# Patient Record
Sex: Female | Born: 1980 | Hispanic: No | Smoking: Never smoker
Health system: Southern US, Community
[De-identification: ages and names within clinical notes are randomized; demographics above are authoritative.]

## PROBLEM LIST (undated history)

## (undated) DIAGNOSIS — D649 Anemia, unspecified: Secondary | ICD-10-CM

## (undated) DIAGNOSIS — J45909 Unspecified asthma, uncomplicated: Secondary | ICD-10-CM

## (undated) DIAGNOSIS — N83209 Unspecified ovarian cyst, unspecified side: Secondary | ICD-10-CM

## (undated) DIAGNOSIS — T7840XA Allergy, unspecified, initial encounter: Secondary | ICD-10-CM

## (undated) HISTORY — DX: Allergy, unspecified, initial encounter: T78.40XA

## (undated) HISTORY — PX: LEEP: SHX91

---

## 2008-07-23 ENCOUNTER — Other Ambulatory Visit: Admission: RE | Admit: 2008-07-23 | Discharge: 2008-07-23 | Payer: Self-pay | Admitting: Obstetrics and Gynecology

## 2008-12-01 ENCOUNTER — Inpatient Hospital Stay (HOSPITAL_COMMUNITY): Admission: AD | Admit: 2008-12-01 | Discharge: 2008-12-02 | Payer: Self-pay | Admitting: Obstetrics and Gynecology

## 2010-06-03 ENCOUNTER — Emergency Department (HOSPITAL_COMMUNITY)
Admission: EM | Admit: 2010-06-03 | Discharge: 2010-06-03 | Payer: Self-pay | Source: Home / Self Care | Admitting: Emergency Medicine

## 2010-10-10 LAB — CBC
HCT: 28.8 % — ABNORMAL LOW (ref 36.0–46.0)
Hemoglobin: 11.8 g/dL — ABNORMAL LOW (ref 12.0–15.0)
MCHC: 35.5 g/dL (ref 30.0–36.0)
MCV: 104.3 fL — ABNORMAL HIGH (ref 78.0–100.0)
Platelets: 152 10*3/uL (ref 150–400)
RBC: 3.21 MIL/uL — ABNORMAL LOW (ref 3.87–5.11)
WBC: 10 10*3/uL (ref 4.0–10.5)

## 2010-11-15 NOTE — Op Note (Signed)
Gabrielle Zimmerman, Gabrielle Zimmerman               ACCOUNT NO.:  [id number]   MEDICAL RECORD NO.:  [id number]          PATIENT TYPE:  INP   LOCATION:  9105                          FACILITY:  [facility]   PHYSICIAN:  Gerald Leitz, MD          DATE OF BIRTH:  1980/11/11   DATE OF PROCEDURE:  12/01/2008  DATE OF DISCHARGE:                               OPERATIVE REPORT   DATE OF DELIVERY:  December 01, 2008.   PREOPERATIVE DIAGNOSES:  1. Term labor.  2. Fetal bradycardia.   POSTOPERATIVE DIAGNOSES:  1. Term labor.  2. Fetal bradycardia.   PROCEDURE:  Vacuum-assisted vaginal delivery.   SURGEON:  Gerald Leitz, MD   ANESTHESIA:  Epidural.   COMPLICATIONS:  None.   INDICATIONS:  A 39-week intrauterine pregnancy with term labor,  completely dilated with fetal bradycardia at a +2 station.   PROCEDURE:  Informed consent was obtained.  The patient was informed of  risks of hematoma.  She voiced understanding and gave consent to proceed  with vacuum delivery.  The position of the baby was OP and the bladder  was drained.  The Kiwi vacuum was placed and set at 550 mmHg with  contraction.  As the patient pushed, the vacuum was used to extract the  baby.  There was one pop off.  The vacuum was then reapplied and  delivery was achieved with a second attempt.  Vacuum was immediately  removed.  The infant's mouth and nose were bulb suctioned.  There was no  nuchal cord.  Anterior shoulder and body were delivered without  difficulty.  The infant was somewhat flaccid and the cord was clamped x2  and cut.  The infant was taken immediately to the warmer.  Apgars were 4  and 9 at one and five minutes respectively.  Infant's weight was 6  pounds 14 ounces.  The placenta was delivered.  The uterus was firmed  with Pitocin and massage.  Second degree perineal laceration was  repaired with 3-0 Vicryl.  The patient tolerated the procedure well.      Gerald Leitz, MD  Electronically Signed    TC/MEDQ  D:  12/02/2008  T:   12/02/2008  Job:  [id number]

## 2011-02-13 ENCOUNTER — Emergency Department (INDEPENDENT_AMBULATORY_CARE_PROVIDER_SITE_OTHER): Payer: BC Managed Care – PPO

## 2011-02-13 ENCOUNTER — Emergency Department (HOSPITAL_BASED_OUTPATIENT_CLINIC_OR_DEPARTMENT_OTHER)
Admission: EM | Admit: 2011-02-13 | Discharge: 2011-02-13 | Disposition: A | Discharge status: Home / Self Care | Payer: BC Managed Care – PPO | Attending: Emergency Medicine | Admitting: Emergency Medicine

## 2011-02-13 DIAGNOSIS — N83209 Unspecified ovarian cyst, unspecified side: Secondary | ICD-10-CM

## 2011-02-13 DIAGNOSIS — N9489 Other specified conditions associated with female genital organs and menstrual cycle: Secondary | ICD-10-CM | POA: Insufficient documentation

## 2011-02-13 DIAGNOSIS — Z975 Presence of (intrauterine) contraceptive device: Secondary | ICD-10-CM

## 2011-02-13 DIAGNOSIS — B9689 Other specified bacterial agents as the cause of diseases classified elsewhere: Secondary | ICD-10-CM | POA: Insufficient documentation

## 2011-02-13 DIAGNOSIS — A499 Bacterial infection, unspecified: Secondary | ICD-10-CM | POA: Insufficient documentation

## 2011-02-13 DIAGNOSIS — N760 Acute vaginitis: Secondary | ICD-10-CM | POA: Insufficient documentation

## 2011-02-13 DIAGNOSIS — N949 Unspecified condition associated with female genital organs and menstrual cycle: Secondary | ICD-10-CM

## 2011-02-13 DIAGNOSIS — R109 Unspecified abdominal pain: Secondary | ICD-10-CM | POA: Insufficient documentation

## 2011-02-13 HISTORY — DX: Anemia, unspecified: D64.9

## 2011-02-13 LAB — COMPREHENSIVE METABOLIC PANEL
ALT: 7 U/L (ref 0–35)
AST: 18 U/L (ref 0–37)
CO2: 23 mEq/L (ref 19–32)
Chloride: 102 mEq/L (ref 96–112)
GFR calc non Af Amer: >60 mL/min (ref >60)
Sodium: 136 mEq/L (ref 135–145)
Total Bilirubin: 0.4 mg/dL (ref 0.3–1.2)

## 2011-02-13 LAB — URINE MICROSCOPIC-ADD ON

## 2011-02-13 LAB — URINALYSIS, ROUTINE W REFLEX MICROSCOPIC
Bilirubin Urine: NEGATIVE
Glucose, UA: NEGATIVE mg/dL
Ketones, ur: 15 mg/dL — AB
Nitrite: NEGATIVE
pH: 8.5 — ABNORMAL HIGH (ref 5.0–8.0)

## 2011-02-13 LAB — WET PREP, GENITAL

## 2011-02-13 LAB — CBC
Platelets: 185 10*3/uL (ref 150–400)
RBC: 3.53 MIL/uL — ABNORMAL LOW (ref 3.87–5.11)
WBC: 9.5 10*3/uL (ref 4.0–10.5)

## 2011-02-13 LAB — RPR: RPR Ser Ql: NONREACTIVE

## 2011-02-13 MED ORDER — METRONIDAZOLE 500 MG PO TABS: 500.0000 mg | ORAL_TABLET | Freq: Two times a day (BID) | ORAL | Status: AC

## 2011-02-13 MED ORDER — METRONIDAZOLE 500 MG PO TABS: 500.0000 mg | ORAL_TABLET | Freq: Two times a day (BID) | ORAL | Status: DC

## 2011-02-13 MED ORDER — OXYCODONE-ACETAMINOPHEN 5-325 MG PO TABS: 1.0000 | ORAL_TABLET | ORAL | Status: AC | PRN

## 2011-02-13 MED ORDER — ONDANSETRON HCL 4 MG/2ML IJ SOLN
4.0000 mg | Freq: Once | INTRAMUSCULAR | Status: AC
  Administered 2011-02-13: 4 mg via INTRAVENOUS
  Filled 2011-02-13: qty 2

## 2011-02-13 MED ORDER — HYDROMORPHONE HCL 1 MG/ML IJ SOLN
1.0000 mg | Freq: Once | INTRAMUSCULAR | Status: AC
  Administered 2011-02-13: 1 mg via INTRAVENOUS
  Filled 2011-02-13: qty 1

## 2011-02-13 MED ORDER — SODIUM CHLORIDE 0.9 % IV BOLUS (SEPSIS)
1000.0000 mL | Freq: Once | INTRAVENOUS | Status: AC
  Administered 2011-02-13: 1000 mL via INTRAVENOUS

## 2011-02-13 NOTE — ED Notes (Signed)
C/o lower back pain and abd pain

## 2011-02-13 NOTE — ED Notes (Signed)
Pt remains in US

## 2011-02-13 NOTE — ED Provider Notes (Signed)
History     CSN: [id number] Arrival date & time: 02/13/2011  1:52 PM  Chief Complaint  Patient presents with  . Back Pain  . Abdominal Pain   Patient is a 30 y.o. female presenting with cramps. The history is provided by the patient.  Abdominal Cramping The primary symptoms of the illness include abdominal pain and nausea. The primary symptoms of the illness do not include fever, vomiting, diarrhea, dysuria, vaginal discharge or vaginal bleeding. Episode onset: PAin started about five hours ago, and has been getting worse. Pain is severe, currently rated at 8/10 with a worst pain of 10/10. The onset of the illness was sudden. The problem has been gradually worsening.  The patient states that she believes she is currently not pregnant (She has an IUD. LMP was July 31.). The patient has not had a change in bowel habit. Symptoms associated with the illness do not include chills, constipation, urgency, hematuria or frequency.  Pain is across the suprapubic area, but worst on the right side. There is radiation to the back. She had a milder pain similar to this one week ago.  Past Medical History  Diagnosis Date  . Anemia     History reviewed. No pertinent past surgical history.  No family history on file.  History  Substance Use Topics  . Smoking status: Never Smoker   . Smokeless tobacco: Not on file  . Alcohol Use: No    OB History    Grav Para Term Preterm Abortions TAB SAB Ect Mult Living                  Review of Systems  Constitutional: Negative for fever and chills.  Gastrointestinal: Positive for nausea and abdominal pain. Negative for vomiting, diarrhea and constipation.  Genitourinary: Negative for dysuria, urgency, frequency, hematuria, vaginal bleeding and vaginal discharge.  All other systems reviewed and are negative.    Physical Exam  BP 112/69  Pulse 69  Temp(Src) 98.1 F (36.7 C) (Oral)  Resp 16  Ht 5\' 6"  (1.676 m)  Wt 162 lb (73.483 kg)  BMI 26.15  kg/m2  SpO2 100%  LMP 01/23/2011  Physical Exam  Nursing note and vitals reviewed. Constitutional: She is oriented to person, place, and time. She appears well-developed and well-nourished.       She appears uncomfortable.  HENT:  Head: Normocephalic and atraumatic.  Right Ear: External ear normal.  Left Ear: External ear normal.  Nose: Nose normal.  Mouth/Throat: Oropharynx is clear and moist.  Eyes: EOM are normal. Pupils are equal, round, and reactive to light. No scleral icterus.  Neck: Normal range of motion. Neck supple. No JVD present.  Cardiovascular: Normal rate, regular rhythm and normal heart sounds.   No murmur heard. Pulmonary/Chest: Effort normal and breath sounds normal. She has no wheezes. She has no rales. She exhibits no tenderness.  Abdominal: Soft. She exhibits no mass. There is no rebound and no guarding.       Moderate suprapubic tenderness without lateralization. Peristalsis is diminished but present.  Genitourinary:       External genitalia is normal. IUD string is in place.Moderate mucoid cervical discharge. Fundus normal size and position, non-tender. Mild left adnexal tenderness without mass. Moderate right adnexal tenderness with approximately 5 cm mass consistent with an ovarian cyst.  Musculoskeletal: Normal range of motion. She exhibits no edema and no tenderness.  Lymphadenopathy:    She has no cervical adenopathy.  Neurological: She is alert and oriented to person,  place, and time.  Skin: Skin is warm and dry. No rash noted.  Psychiatric: She has a normal mood and affect.    ED Course  Procedures  MDM Pelvic pain most likely from ovarian cyst. Doubt PID, appendicitis. Pelvic ultrasound confirms cyst. Pain improved after Dilaudid, Zofran. Labs, x-rays reviewed.  Results for orders placed during the hospital encounter of 02/13/11  URINALYSIS, ROUTINE W REFLEX MICROSCOPIC      Component Value Range   Color, Urine YELLOW  YELLOW    Appearance  CLEAR  CLEAR    Specific Gravity, Urine 1.028  1.005 - 1.030    pH 8.5 (*) 5.0 - 8.0    Glucose, UA NEGATIVE  NEGATIVE (mg/dL)   Hgb urine dipstick NEGATIVE  NEGATIVE    Bilirubin Urine NEGATIVE  NEGATIVE    Ketones, ur 15 (*) NEGATIVE (mg/dL)   Protein, ur 30 (*) NEGATIVE (mg/dL)   Urobilinogen, UA 1.0  0.0 - 1.0 (mg/dL)   Nitrite NEGATIVE  NEGATIVE    Leukocytes, UA NEGATIVE  NEGATIVE   PREGNANCY, URINE      Component Value Range   Preg Test, Ur NEGATIVE    URINE MICROSCOPIC-ADD ON      Component Value Range   Squamous Epithelial / LPF RARE  RARE    WBC, UA 0-2  <3 (WBC/hpf)   RBC / HPF 0-2  <3 (RBC/hpf)   Urine-Other MUCOUS PRESENT    CBC      Component Value Range   WBC 9.5  4.0 - 10.5 (K/uL)   RBC 3.53 (*) 3.87 - 5.11 (MIL/uL)   Hemoglobin 11.6 (*) 12.0 - 15.0 (g/dL)   HCT 21.3 (*) 08.6 - 46.0 (%)   MCV 95.2  78.0 - 100.0 (fL)   MCH 32.9  26.0 - 34.0 (pg)   MCHC 34.5  30.0 - 36.0 (g/dL)   RDW 57.8  46.9 - 62.9 (%)   Platelets 185  150 - 400 (K/uL)  COMPREHENSIVE METABOLIC PANEL      Component Value Range   Sodium 136  135 - 145 (mEq/L)   Potassium 3.8  3.5 - 5.1 (mEq/L)   Chloride 102  96 - 112 (mEq/L)   CO2 23  19 - 32 (mEq/L)   Glucose, Bld 103 (*) 70 - 99 (mg/dL)   BUN 15  6 - 23 (mg/dL)   Creatinine, Ser 5.28  0.50 - 1.10 (mg/dL)   Calcium 9.4  8.4 - 41.3 (mg/dL)   Total Protein 7.8  6.0 - 8.3 (g/dL)   Albumin 4.2  3.5 - 5.2 (g/dL)   AST 18  0 - 37 (U/L)   ALT 7  0 - 35 (U/L)   Alkaline Phosphatase 42  39 - 117 (U/L)   Total Bilirubin 0.4  0.3 - 1.2 (mg/dL)   GFR calc non Af Amer >60  >60 (mL/min)   GFR calc Af Amer >60  >60 (mL/min)  WET PREP, GENITAL      Component Value Range   Yeast, Wet Prep NONE SEEN  NONE SEEN    Trich, Wet Prep NONE SEEN  NONE SEEN    Clue Cells, Wet Prep MODERATE (*) NONE SEEN    WBC, Wet Prep HPF POC FEW (*) NONE SEEN    US Transvaginal Non-ob  02/13/2011  *RADIOLOGY REPORT*  Clinical Data: Pelvic pain radiating to lower  back, IUD  TRANSABDOMINAL AND TRANSVAGINAL ULTRASOUND OF PELVIS Technique:  Both transabdominal and transvaginal ultrasound examinations of the pelvis were performed. Transabdominal technique  was performed for global imaging of the pelvis including uterus, ovaries, adnexal regions, and pelvic cul-de-sac.  Comparison: None.   It was necessary to proceed with endovaginal exam following the transabdominal exam to visualize the endometrium and right ovary.  Findings:  Uterus: Normal in size and appearance, measuring 9.2 x 3.7 x 4.8 cm  Endometrium: Normal in thickness and appearance, measuring 5 mm. IUD in satisfactory position.  Right ovary:  Normal appearance/no adnexal mass, measuring 3.7 x 2.2 x 2.6 cm.  Left ovary: Measures 5.8 x 4.4 x 6.0 cm and is notable for a dominant 4.3 x 2.7 x 4.0 cm simple cyst.  Other findings: Small volume pelvic fluid.  IMPRESSION: 4.3 cm left ovarian cyst, simple. Follow-up ultrasound is suggested in 6 weeks.  IUD in satisfactory position.  Original Report Authenticated By: Charline Bills, M.D.   US Pelvis Complete  02/13/2011  *RADIOLOGY REPORT*  Clinical Data: Pelvic pain radiating to lower back, IUD  TRANSABDOMINAL AND TRANSVAGINAL ULTRASOUND OF PELVIS Technique:  Both transabdominal and transvaginal ultrasound examinations of the pelvis were performed. Transabdominal technique was performed for global imaging of the pelvis including uterus, ovaries, adnexal regions, and pelvic cul-de-sac.  Comparison: None.   It was necessary to proceed with endovaginal exam following the transabdominal exam to visualize the endometrium and right ovary.  Findings:  Uterus: Normal in size and appearance, measuring 9.2 x 3.7 x 4.8 cm  Endometrium: Normal in thickness and appearance, measuring 5 mm. IUD in satisfactory position.  Right ovary:  Normal appearance/no adnexal mass, measuring 3.7 x 2.2 x 2.6 cm.  Left ovary: Measures 5.8 x 4.4 x 6.0 cm and is notable for a dominant 4.3 x 2.7 x 4.0 cm  simple cyst.  Other findings: Small volume pelvic fluid.  IMPRESSION: 4.3 cm left ovarian cyst, simple. Follow-up ultrasound is suggested in 6 weeks.  IUD in satisfactory position.  Original Report Authenticated By: Charline Bills, M.D.      Dione Booze, MD 02/13/11 [phone]

## 2011-02-13 NOTE — ED Notes (Signed)
DC IV per RN 

## 2011-02-16 ENCOUNTER — Other Ambulatory Visit (HOSPITAL_COMMUNITY)
Admission: RE | Admit: 2011-02-16 | Discharge: 2011-02-16 | Disposition: A | Discharge status: Home / Self Care | Payer: BC Managed Care – PPO | Source: Ambulatory Visit | Attending: Obstetrics and Gynecology | Admitting: Obstetrics and Gynecology

## 2011-02-16 DIAGNOSIS — Z113 Encounter for screening for infections with a predominantly sexual mode of transmission: Secondary | ICD-10-CM | POA: Insufficient documentation

## 2011-02-16 DIAGNOSIS — Z01419 Encounter for gynecological examination (general) (routine) without abnormal findings: Secondary | ICD-10-CM | POA: Insufficient documentation

## 2011-05-02 ENCOUNTER — Ambulatory Visit (INDEPENDENT_AMBULATORY_CARE_PROVIDER_SITE_OTHER): Payer: BC Managed Care – PPO | Admitting: Licensed Clinical Social Worker

## 2011-05-02 DIAGNOSIS — F331 Major depressive disorder, recurrent, moderate: Secondary | ICD-10-CM

## 2011-05-02 DIAGNOSIS — F411 Generalized anxiety disorder: Secondary | ICD-10-CM

## 2011-05-09 ENCOUNTER — Ambulatory Visit (INDEPENDENT_AMBULATORY_CARE_PROVIDER_SITE_OTHER): Payer: BC Managed Care – PPO | Admitting: Licensed Clinical Social Worker

## 2011-05-09 DIAGNOSIS — F331 Major depressive disorder, recurrent, moderate: Secondary | ICD-10-CM

## 2011-05-09 DIAGNOSIS — F411 Generalized anxiety disorder: Secondary | ICD-10-CM

## 2011-05-19 ENCOUNTER — Ambulatory Visit: Payer: BC Managed Care – PPO | Admitting: Licensed Clinical Social Worker

## 2013-09-01 ENCOUNTER — Emergency Department (HOSPITAL_BASED_OUTPATIENT_CLINIC_OR_DEPARTMENT_OTHER)
Admission: EM | Admit: 2013-09-01 | Discharge: 2013-09-01 | Disposition: A | Discharge status: Home / Self Care | Payer: BC Managed Care – PPO | Attending: Emergency Medicine | Admitting: Emergency Medicine

## 2013-09-01 ENCOUNTER — Emergency Department (HOSPITAL_BASED_OUTPATIENT_CLINIC_OR_DEPARTMENT_OTHER): Payer: BC Managed Care – PPO

## 2013-09-01 ENCOUNTER — Encounter (HOSPITAL_BASED_OUTPATIENT_CLINIC_OR_DEPARTMENT_OTHER): Payer: Self-pay | Admitting: Emergency Medicine

## 2013-09-01 DIAGNOSIS — Z862 Personal history of diseases of the blood and blood-forming organs and certain disorders involving the immune mechanism: Secondary | ICD-10-CM | POA: Insufficient documentation

## 2013-09-01 DIAGNOSIS — B349 Viral infection, unspecified: Secondary | ICD-10-CM

## 2013-09-01 DIAGNOSIS — B9789 Other viral agents as the cause of diseases classified elsewhere: Secondary | ICD-10-CM | POA: Insufficient documentation

## 2013-09-01 DIAGNOSIS — Z3202 Encounter for pregnancy test, result negative: Secondary | ICD-10-CM | POA: Insufficient documentation

## 2013-09-01 DIAGNOSIS — N949 Unspecified condition associated with female genital organs and menstrual cycle: Secondary | ICD-10-CM | POA: Insufficient documentation

## 2013-09-01 DIAGNOSIS — Z79899 Other long term (current) drug therapy: Secondary | ICD-10-CM | POA: Insufficient documentation

## 2013-09-01 LAB — CBC WITH DIFFERENTIAL/PLATELET
Basophils Absolute: 0 10*3/uL (ref 0.0–0.1)
Basophils Relative: 0 % (ref 0–1)
Eosinophils Absolute: 0 10*3/uL (ref 0.0–0.7)
Eosinophils Relative: 0 % (ref 0–5)
HEMATOCRIT: 32.8 % — AB (ref 36.0–46.0)
HEMOGLOBIN: 11 g/dL — AB (ref 12.0–15.0)
LYMPHS ABS: 0.8 10*3/uL (ref 0.7–4.0)
Lymphocytes Relative: 17 % (ref 12–46)
MCH: 33.5 pg (ref 26.0–34.0)
MCHC: 33.5 g/dL (ref 30.0–36.0)
MCV: 100 fL (ref 78.0–100.0)
MONO ABS: 0.4 10*3/uL (ref 0.1–1.0)
MONOS PCT: 9 % (ref 3–12)
NEUTROS ABS: 3.6 10*3/uL (ref 1.7–7.7)
NEUTROS PCT: 74 % (ref 43–77)
Platelets: 172 10*3/uL (ref 150–400)
RBC: 3.28 MIL/uL — AB (ref 3.87–5.11)
RDW: 11.9 % (ref 11.5–15.5)
WBC: 4.8 10*3/uL (ref 4.0–10.5)

## 2013-09-01 LAB — URINALYSIS, ROUTINE W REFLEX MICROSCOPIC
Bilirubin Urine: NEGATIVE
GLUCOSE, UA: NEGATIVE mg/dL
HGB URINE DIPSTICK: NEGATIVE
KETONES UR: NEGATIVE mg/dL
LEUKOCYTES UA: NEGATIVE
Nitrite: NEGATIVE
PH: 6 (ref 5.0–8.0)
Protein, ur: NEGATIVE mg/dL
Specific Gravity, Urine: 1.017 (ref 1.005–1.030)
Urobilinogen, UA: 0.2 mg/dL (ref 0.0–1.0)

## 2013-09-01 LAB — BASIC METABOLIC PANEL
BUN: 8 mg/dL (ref 6–23)
CHLORIDE: 100 meq/L (ref 96–112)
CO2: 27 mEq/L (ref 19–32)
Calcium: 8.7 mg/dL (ref 8.4–10.5)
Creatinine, Ser: 0.9 mg/dL (ref 0.50–1.10)
GFR calc Af Amer: >90 mL/min (ref >90)
GFR calc non Af Amer: 85 mL/min — ABNORMAL LOW (ref >90)
GLUCOSE: 92 mg/dL (ref 70–99)
POTASSIUM: 3.4 meq/L — AB (ref 3.7–5.3)
Sodium: 139 mEq/L (ref 137–147)

## 2013-09-01 LAB — PREGNANCY, URINE: PREG TEST UR: NEGATIVE

## 2013-09-01 MED ORDER — KETOROLAC TROMETHAMINE 30 MG/ML IJ SOLN
30.0000 mg | Freq: Once | INTRAMUSCULAR | Status: AC
  Administered 2013-09-01: 30 mg via INTRAVENOUS
  Filled 2013-09-01: qty 2

## 2013-09-01 MED ORDER — KETOROLAC TROMETHAMINE 15 MG/ML IJ SOLN
INTRAMUSCULAR | Status: AC
  Filled 2013-09-01: qty 1

## 2013-09-01 MED ORDER — SODIUM CHLORIDE 0.9 % IV BOLUS (SEPSIS)
1000.0000 mL | Freq: Once | INTRAVENOUS | Status: AC
  Administered 2013-09-01: 1000 mL via INTRAVENOUS

## 2013-09-01 NOTE — Discharge Instructions (Signed)

## 2013-09-01 NOTE — ED Provider Notes (Signed)
CSN: [id number]     Arrival date & time 09/01/13  1833 History  This chart was scribed for Rolan Bucco, MD by Beverly Milch, ED Scribe. This patient was seen in room MH03/MH03 and the patient's care was started at 8:03 PM.    Chief Complaint  Patient presents with  . Weakness      The history is provided by the patient. No language interpreter was used.   HPI Comments: Donneisha Beane Hall-El is a 33 y.o. female who presents to the Emergency Department complaining of generalized weakness that began yesterday. She reports associated cough with associated pelvic pain, fever (99.6 F per TVs), sinus drainage, parietal headache. Pt also reports an episode of syncope yesterday morning while in the shower. Pt states she saw Dr. Reola Calkins today with a fever of 101.6 F and was referred here. Pt states the pain in her pelvis that accompanies cough is similar to when she had ovarian cysts. Pt denies vomiting, diarrhea, dysuria, vaginal discharge, chest pain, chest congestion, and SOB. Pt states her youngest son had a fever 4 days ago that resolved in a day.      Past Medical History  Diagnosis Date  . Anemia    History reviewed. No pertinent past surgical history. No family history on file.  History  Substance Use Topics  . Smoking status: Never Smoker   . Smokeless tobacco: Not on file  . Alcohol Use: No    OB History   Grav Para Term Preterm Abortions TAB SAB Ect Mult Living                 Review of Systems  Constitutional: Positive for fever, appetite change and fatigue. Negative for chills and diaphoresis.  HENT: Positive for congestion and rhinorrhea. Negative for sneezing.   Eyes: Negative.   Respiratory: Positive for cough. Negative for chest tightness and shortness of breath.   Cardiovascular: Negative for chest pain and leg swelling.  Gastrointestinal: Negative for nausea, vomiting, abdominal pain, diarrhea and blood in stool.  Genitourinary: Positive for pelvic pain. Negative  for frequency, hematuria, flank pain and difficulty urinating.  Musculoskeletal: Positive for myalgias. Negative for arthralgias and back pain.  Skin: Negative for rash.  Neurological: Positive for headaches. Negative for dizziness, speech difficulty, weakness and numbness.      Allergies  Review of patient's allergies indicates no known allergies.  Home Medications   Current Outpatient Rx  Name  Route  Sig  Dispense  Refill  . Sertraline HCl (ZOLOFT PO)   Oral   Take by mouth.         . Emollient (AMBI FADE NORMAL SKIN) CREA   Apply externally   Apply topically 2 (two) times daily.           . naphazoline (CLEAR EYES) 0.012 % ophthalmic solution   Both Eyes   Place 1-2 drops into both eyes 4 (four) times daily as needed. Dry eyes          . tretinoin microspheres (RETIN-A MICRO) 0.04 % gel   Topical   Apply topically at bedtime.            Triage Vitals: BP 114/80  Pulse 97  Temp(Src) 99.6 F (37.6 C) (Oral)  Resp 18  Ht 5\' 6"  (1.676 m)  Wt 165 lb (74.844 kg)  BMI 26.64 kg/m2  Physical Exam  Constitutional: She is oriented to person, place, and time. She appears well-developed and well-nourished.  HENT:  Head: Normocephalic and atraumatic.  Eyes: Pupils are equal, round, and reactive to light.  No photophobia  Neck: Normal range of motion. Neck supple.  No meningismus  Cardiovascular: Normal rate, regular rhythm and normal heart sounds.   Pulmonary/Chest: Effort normal and breath sounds normal. No respiratory distress. She has no wheezes. She has no rales. She exhibits no tenderness.  Abdominal: Soft. Bowel sounds are normal. There is no tenderness. There is no rebound and no guarding.  Musculoskeletal: Normal range of motion. She exhibits no edema.  Lymphadenopathy:    She has no cervical adenopathy.  Neurological: She is alert and oriented to person, place, and time.  Skin: Skin is warm and dry. No rash noted.  Psychiatric: She has a normal mood and  affect.    ED Course  Procedures (including critical care time)  DIAGNOSTIC STUDIES:   COORDINATION OF CARE: 8:07 PM- Pt advised of plan for treatment and pt agrees. Blood work, IV fluids,    Labs Review Results for orders placed during the hospital encounter of 09/01/13  URINALYSIS, ROUTINE W REFLEX MICROSCOPIC      Result Value Ref Range   Color, Urine YELLOW  YELLOW   APPearance CLOUDY (*) CLEAR   Specific Gravity, Urine 1.017  1.005 - 1.030   pH 6.0  5.0 - 8.0   Glucose, UA NEGATIVE  NEGATIVE mg/dL   Hgb urine dipstick NEGATIVE  NEGATIVE   Bilirubin Urine NEGATIVE  NEGATIVE   Ketones, ur NEGATIVE  NEGATIVE mg/dL   Protein, ur NEGATIVE  NEGATIVE mg/dL   Urobilinogen, UA 0.2  0.0 - 1.0 mg/dL   Nitrite NEGATIVE  NEGATIVE   Leukocytes, UA NEGATIVE  NEGATIVE  PREGNANCY, URINE      Result Value Ref Range   Preg Test, Ur NEGATIVE  NEGATIVE  CBC WITH DIFFERENTIAL      Result Value Ref Range   WBC 4.8  4.0 - 10.5 K/uL   RBC 3.28 (*) 3.87 - 5.11 MIL/uL   Hemoglobin 11.0 (*) 12.0 - 15.0 g/dL   HCT 78.4 (*) 69.6 - 29.5 %   MCV 100.0  78.0 - 100.0 fL   MCH 33.5  26.0 - 34.0 pg   MCHC 33.5  30.0 - 36.0 g/dL   RDW 28.4  13.2 - 44.0 %   Platelets 172  150 - 400 K/uL   Neutrophils Relative % 74  43 - 77 %   Neutro Abs 3.6  1.7 - 7.7 K/uL   Lymphocytes Relative 17  12 - 46 %   Lymphs Abs 0.8  0.7 - 4.0 K/uL   Monocytes Relative 9  3 - 12 %   Monocytes Absolute 0.4  0.1 - 1.0 K/uL   Eosinophils Relative 0  0 - 5 %   Eosinophils Absolute 0.0  0.0 - 0.7 K/uL   Basophils Relative 0  0 - 1 %   Basophils Absolute 0.0  0.0 - 0.1 K/uL  BASIC METABOLIC PANEL      Result Value Ref Range   Sodium 139  137 - 147 mEq/L   Potassium 3.4 (*) 3.7 - 5.3 mEq/L   Chloride 100  96 - 112 mEq/L   CO2 27  19 - 32 mEq/L   Glucose, Bld 92  70 - 99 mg/dL   BUN 8  6 - 23 mg/dL   Creatinine, Ser 1.02  0.50 - 1.10 mg/dL   Calcium 8.7  8.4 - 72.5 mg/dL   GFR calc non Af Amer 85 (*) >90 mL/min  GFR calc Af Amer >90  >90 mL/min   Dg Chest 2 View  09/01/2013   CLINICAL DATA:  Cough and weakness.  EXAM: CHEST  2 VIEW  COMPARISON:  None.  FINDINGS: The heart size and mediastinal contours are within normal limits. Both lungs are clear. The visualized skeletal structures are unremarkable.  IMPRESSION: No active cardiopulmonary disease.   Electronically Signed   By: Elberta Fortisaniel  Boyle M.D.   On: 09/01/2013 20:06     Imaging Review Dg Chest 2 View  09/01/2013   CLINICAL DATA:  Cough and weakness.  EXAM: CHEST  2 VIEW  COMPARISON:  None.  FINDINGS: The heart size and mediastinal contours are within normal limits. Both lungs are clear. The visualized skeletal structures are unremarkable.  IMPRESSION: No active cardiopulmonary disease.   Electronically Signed   By: Elberta Fortisaniel  Boyle M.D.   On: 09/01/2013 20:06     EKG Interpretation None      MDM   Final diagnoses:  Viral syndrome    PT with fatigue, myalgias, sinus drainage, symptoms consistent with viral syndrome.  Pt has syncopal episode yesterday when symptoms first began.  No CP, SOB.  Symptoms not consistent with arrythmia, ACS, PE.  Pt given IVFs, toradol, feeling better.  No suggestions of meningitis.  No pneumonia.  Will d/c, given symptomatic care instructions.  Advised to f/u with PMD if symptoms not improving, return if symptoms worsen.  I personally performed the services described in this documentation, which was scribed in my presence.  The recorded information has been reviewed and considered.    Rolan BuccoMelanie Gustaf Mccarter, MD 09/01/13 2119

## 2013-09-01 NOTE — ED Notes (Signed)
Weakness, sinus drainage, fever, and headache. Was seen by her MD today. Temp was 101.6. She was told to come here.

## 2019-04-21 ENCOUNTER — Other Ambulatory Visit: Payer: Self-pay

## 2019-04-21 ENCOUNTER — Emergency Department
Admission: EM | Admit: 2019-04-21 | Discharge: 2019-04-21 | Disposition: A | Discharge status: Home / Self Care | Payer: Self-pay | Source: Home / Self Care | Attending: Emergency Medicine | Admitting: Emergency Medicine

## 2019-04-21 ENCOUNTER — Encounter: Payer: Self-pay | Admitting: Emergency Medicine

## 2019-04-21 DIAGNOSIS — K641 Second degree hemorrhoids: Secondary | ICD-10-CM

## 2019-04-21 MED ORDER — HYDROCORTISONE ACETATE 25 MG RE SUPP: 25.0000 mg | Freq: Two times a day (BID) | RECTAL | 0 refills | Status: DC

## 2019-04-21 MED ORDER — LIDOCAINE 5 % EX OINT: 1.0000 "application " | TOPICAL_OINTMENT | CUTANEOUS | 0 refills | Status: DC | PRN

## 2019-04-21 NOTE — Discharge Instructions (Addendum)
On physical exam, you have internal hemorrhoids that prolapsed externally.  In my opinion, no surgery is needed at this point.  They likely will get better on their own with conservative treatment. Treatment: May continue Preparation H.   Prescriptions: Lidocaine ointment, apply sparingly twice a day for pain. Anusol HC suppository, may use up to twice a day. Soften stools to prevent straining.  Such as Colace over-the-counter which is a stool softener.  If you are having severe constipation, try MiraLAX OTC for 3 days.  Follow instructions on label. Please read attached instruction sheet on hemorrhoids. May use Tylenol or OTC ibuprofen as needed for pain. If hemorrhoids not improving in a week, or if much worse, you may need to see a general surgeon to evaluate for surgical excision.-However, I do not think it is likely you will need to see a surgeon.

## 2019-04-21 NOTE — ED Provider Notes (Signed)
[facility]    CSN: [id number] Arrival date & time: 04/21/19  2130      History   Chief Complaint Chief Complaint  Patient presents with  . Hemorrhoids    HPI Gabrielle Zimmerman is a 38 y.o. female.   HPI Hemorrhoids since yesterday, first episode in 9 years.  Tried Preparation H, that helped minimally. Moderate pain to touch or having a BM.  No blood.  No melena.  No abdominal pain. No fever or chills or nausea or vomiting.  No diarrhea.  Admits occasionally constipated and strains at times.  No chest pain or shortness of breath. Denies GYN symptoms.  She denies chance of pregnancy. Past Medical History:  Diagnosis Date  . Anemia     There are no active problems to display for this patient.   History reviewed. No pertinent surgical history.  OB History   No obstetric history on file.      Home Medications    Prior to Admission medications   Medication Sig Start Date End Date Taking? Authorizing Provider  hydrocortisone (ANUSOL-HC) 25 MG suppository Place 1 suppository (25 mg total) rectally 2 (two) times daily. 04/21/19   Lajean Manes, MD  lidocaine (XYLOCAINE) 5 % ointment Apply 1 application topically as needed. Up to twice a day as needed for hemorrhoidal pain.  (Wear gloves when applying.) 04/21/19   Lajean Manes, MD  Sertraline HCl (ZOLOFT PO) Take by mouth.    [provider]    Family History Family History  Problem Relation Age of Onset  . Healthy Mother   . Diabetes Father     Social History Social History   Tobacco Use  . Smoking status: Never Smoker  . Smokeless tobacco: Never Used  Substance Use Topics  . Alcohol use: Yes  . Drug use: Not on file     Allergies   Patient has no known allergies.   Review of Systems Review of Systems  All other systems reviewed and are negative.    Physical Exam Triage Vital Signs ED Triage Vitals  Enc Vitals Group     BP 04/21/19 0955 110/76     Pulse Rate 04/21/19  0955 79     Resp --      Temp 04/21/19 0955 98.5 F (36.9 C)     Temp Source 04/21/19 0955 Oral     SpO2 04/21/19 0955 99 %     Weight 04/21/19 0956 160 lb (72.6 kg)     Height 04/21/19 0956 5\' 6"  (1.676 m)     Head Circumference --      Peak Flow --      Pain Score 04/21/19 0956 7     Pain Loc --      Pain Edu? --      Excl. in GC? --    No data found.  Updated Vital Signs BP 110/76 (BP Location: Right Arm)   Pulse 79   Temp 98.5 F (36.9 C) (Oral)   Ht 5\' 6"  (1.676 m)   Wt 72.6 kg   LMP 04/06/2019 (Exact Date)   SpO2 99%   BMI 25.82 kg/m   Visual Acuity Right Eye Distance:   Left Eye Distance:   Bilateral Distance:    Right Eye Near:   Left Eye Near:    Bilateral Near:     Physical Exam Vitals signs reviewed.  Constitutional:      General: She is not in acute distress.    Appearance: She is  well-developed.  HENT:     Head: Normocephalic and atraumatic.  Eyes:     General: No scleral icterus.    Pupils: Pupils are equal, round, and reactive to light.  Neck:     Musculoskeletal: Normal range of motion and neck supple.  Cardiovascular:     Rate and Rhythm: Normal rate and regular rhythm.  Pulmonary:     Effort: Pulmonary effort is normal.  Abdominal:     General: There is no distension.  Skin:    General: Skin is warm and dry.  Neurological:     Mental Status: She is alert and oriented to person, place, and time.     Cranial Nerves: No cranial nerve deficit.  Psychiatric:        Behavior: Behavior normal.    Exam was chaperoned-nurse assisted: Anal inspection shows 2 separate indurated, extremely tender external hemorrhoids, each 8 x 8 mm, which can be reduced.  No other masses or lesions seen or felt.   UC Treatments / Results  Labs (all labs ordered are listed, but only abnormal results are displayed) Labs Reviewed - No data to display  EKG   Radiology No results found.  Procedures Procedures (including critical care time)   Medications Ordered in UC Medications - No data to display  Initial Impression / Assessment and Plan / UC Course  I have reviewed the triage vital signs and the nursing notes.  Pertinent labs & imaging results that were available during my care of the patient were reviewed by me and considered in my medical decision making (see chart for details).     Grade 2 hemorrhoids, most likely can be treated conservatively.  Explained this to patient who agrees.  Verbal instructions and AVS printed.  Questions invited and answered.  Patient agrees with treatment plan. Final Clinical Impressions(s) / UC Diagnoses   Final diagnoses:  Grade II hemorrhoids     Discharge Instructions     On physical exam, you have internal hemorrhoids that prolapsed externally.  In my opinion, no surgery is needed at this point.  They likely will get better on their own with conservative treatment. Treatment: May continue Preparation H.   Prescriptions: Lidocaine ointment, apply sparingly twice a day for pain. Anusol HC suppository, may use up to twice a day. Soften stools to prevent straining.  Such as Colace over-the-counter which is a stool softener.  If you are having severe constipation, try MiraLAX OTC for 3 days.  Follow instructions on label. Please read attached instruction sheet on hemorrhoids. May use Tylenol or OTC ibuprofen as needed for pain. If hemorrhoids not improving in a week, or if much worse, you may need to see a general surgeon to evaluate for surgical excision.-However, I do not think it is likely you will need to see a surgeon.    ED Prescriptions    Medication Sig Dispense Auth. Provider   hydrocortisone (ANUSOL-HC) 25 MG suppository Place 1 suppository (25 mg total) rectally 2 (two) times daily. 12 suppository Jacqulyn Cane, MD   lidocaine (XYLOCAINE) 5 % ointment Apply 1 application topically as needed. Up to twice a day as needed for hemorrhoidal pain.  (Wear gloves when applying.)  35.44 g Jacqulyn Cane, MD     PDMP not reviewed this encounter.   Jacqulyn Cane, MD 04/22/19 [phone]

## 2019-04-21 NOTE — ED Triage Notes (Signed)
Hemorrhoids since yesterday, first episode in 9 years

## 2019-06-20 DIAGNOSIS — U071 COVID-19: Secondary | ICD-10-CM | POA: Diagnosis not present

## 2019-06-20 DIAGNOSIS — Z20828 Contact with and (suspected) exposure to other viral communicable diseases: Secondary | ICD-10-CM | POA: Diagnosis not present

## 2019-08-18 DIAGNOSIS — N926 Irregular menstruation, unspecified: Secondary | ICD-10-CM | POA: Diagnosis not present

## 2019-08-31 ENCOUNTER — Encounter (HOSPITAL_COMMUNITY): Payer: Self-pay | Admitting: Emergency Medicine

## 2019-08-31 ENCOUNTER — Emergency Department (HOSPITAL_COMMUNITY)
Admission: EM | Admit: 2019-08-31 | Discharge: 2019-08-31 | Disposition: A | Discharge status: Home / Self Care | Payer: Medicaid Other | Attending: Emergency Medicine | Admitting: Emergency Medicine

## 2019-08-31 ENCOUNTER — Other Ambulatory Visit: Payer: Self-pay

## 2019-08-31 ENCOUNTER — Emergency Department (HOSPITAL_COMMUNITY): Payer: Medicaid Other

## 2019-08-31 DIAGNOSIS — F329 Major depressive disorder, single episode, unspecified: Secondary | ICD-10-CM | POA: Diagnosis not present

## 2019-08-31 DIAGNOSIS — R0789 Other chest pain: Secondary | ICD-10-CM | POA: Diagnosis not present

## 2019-08-31 DIAGNOSIS — R10815 Periumbilic abdominal tenderness: Secondary | ICD-10-CM | POA: Insufficient documentation

## 2019-08-31 DIAGNOSIS — R109 Unspecified abdominal pain: Secondary | ICD-10-CM

## 2019-08-31 DIAGNOSIS — R5383 Other fatigue: Secondary | ICD-10-CM | POA: Insufficient documentation

## 2019-08-31 DIAGNOSIS — R102 Pelvic and perineal pain: Secondary | ICD-10-CM | POA: Diagnosis not present

## 2019-08-31 DIAGNOSIS — F3289 Other specified depressive episodes: Secondary | ICD-10-CM

## 2019-08-31 DIAGNOSIS — Z3A01 Less than 8 weeks gestation of pregnancy: Secondary | ICD-10-CM | POA: Diagnosis not present

## 2019-08-31 DIAGNOSIS — O99341 Other mental disorders complicating pregnancy, first trimester: Secondary | ICD-10-CM | POA: Diagnosis not present

## 2019-08-31 DIAGNOSIS — O26891 Other specified pregnancy related conditions, first trimester: Secondary | ICD-10-CM | POA: Diagnosis not present

## 2019-08-31 DIAGNOSIS — O208 Other hemorrhage in early pregnancy: Secondary | ICD-10-CM | POA: Diagnosis not present

## 2019-08-31 LAB — TROPONIN I (HIGH SENSITIVITY)
Troponin I (High Sensitivity): <2 ng/L (ref <18)
Troponin I (High Sensitivity): <2 ng/L (ref <18)

## 2019-08-31 LAB — BASIC METABOLIC PANEL
Anion gap: 8 (ref 5–15)
BUN: 18 mg/dL (ref 6–20)
CO2: 24 mmol/L (ref 22–32)
Calcium: 9 mg/dL (ref 8.9–10.3)
Chloride: 104 mmol/L (ref 98–111)
Creatinine, Ser: 0.76 mg/dL (ref 0.44–1.00)
GFR calc Af Amer: >60 mL/min (ref >60)
GFR calc non Af Amer: >60 mL/min (ref >60)
Glucose, Bld: 96 mg/dL (ref 70–99)
Potassium: 3.2 mmol/L — ABNORMAL LOW (ref 3.5–5.1)
Sodium: 136 mmol/L (ref 135–145)

## 2019-08-31 LAB — PROTIME-INR
INR: 1.1 (ref 0.8–1.2)
Prothrombin Time: 13.6 seconds (ref 11.4–15.2)

## 2019-08-31 LAB — CBC
HCT: 31.1 % — ABNORMAL LOW (ref 36.0–46.0)
Hemoglobin: 10.4 g/dL — ABNORMAL LOW (ref 12.0–15.0)
MCH: 33.3 pg (ref 26.0–34.0)
MCHC: 33.4 g/dL (ref 30.0–36.0)
MCV: 99.7 fL (ref 80.0–100.0)
Platelets: 192 10*3/uL (ref 150–400)
RBC: 3.12 MIL/uL — ABNORMAL LOW (ref 3.87–5.11)
RDW: 12 % (ref 11.5–15.5)
WBC: 8.8 10*3/uL (ref 4.0–10.5)
nRBC: 0 % (ref 0.0–0.2)

## 2019-08-31 LAB — HCG, QUANTITATIVE, PREGNANCY: hCG, Beta Chain, Quant, S: 39420 m[IU]/mL — ABNORMAL HIGH (ref <5)

## 2019-08-31 MED ORDER — SODIUM CHLORIDE 0.9% FLUSH: 3.0000 mL | Freq: Once | INTRAVENOUS | Status: DC

## 2019-08-31 NOTE — ED Notes (Signed)
Patient verbalizes understanding of discharge instructions. Opportunity for questioning and answers were provided. Armband removed by staff, pt discharged from ED.  

## 2019-08-31 NOTE — ED Provider Notes (Signed)
[facility] EMERGENCY DEPARTMENT Provider Note  CSN: [id number] Arrival date & time: 08/31/19 7026  Chief Complaint(s) [redacted] weeks pregnant / Chest Pain  HPI Gabrielle Zimmerman is a 39 y.o. female G87P3 with 3 abortions. LMP 07/17/19, who presents to the emergency department with 3 to 4 weeks of numerous intermittent symptoms.  She reports that she has been having intermittent hot flashes followed by lower back pain, hip pain and pelvic pain.  Patient also reports that at times she has chest discomfort and palpitations consistent with her anxiety.  With that she has shortness of breath.  She also has been extremely fatigued.  Reports that this all began when she discovered that she was pregnant.  States that she has been depressed and lying in bed.  Patient is still getting up and doing her ADLs and going to work.  Patient reports that her symptoms typically improve when she lies down and rests.  She denies any vaginal bleeding or discharge.  Currently her symptoms are very mild.  No recent fevers or infections.  No other physical complaints.  HPI     Past Medical History Past Medical History:  Diagnosis Date  . Anemia    There are no problems to display for this patient.  Home Medication(s) Prior to Admission medications   Not on File                                                                                                                                    Past Surgical History History reviewed. No pertinent surgical history. Family History Family History  Problem Relation Age of Onset  . Healthy Mother   . Diabetes Father     Social History Social History   Tobacco Use  . Smoking status: Never Smoker  . Smokeless tobacco: Never Used  Substance Use Topics  . Alcohol use: Yes  . Drug use: Not on file   Allergies Patient has no known allergies.  Review of Systems Review of Systems All other systems are reviewed and are negative for acute change  except as noted in the HPI  Physical Exam Vital Signs  I have reviewed the triage vital signs BP 105/67   Pulse 80   Temp 98.2 F (36.8 C) (Oral)   Resp 18   Ht 5\' 6"  (1.676 m)   Wt 75 kg   LMP 07/17/19   SpO2 98%   BMI 26.69 kg/m   Physical Exam Vitals reviewed.  Constitutional:      General: She is not in acute distress.    Appearance: She is well-developed. She is not diaphoretic.  HENT:     Head: Normocephalic and atraumatic.     Nose: Nose normal.  Eyes:     General: No scleral icterus.       Right eye: No discharge.        Left eye: No discharge.  Conjunctiva/sclera: Conjunctivae normal.     Pupils: Pupils are equal, round, and reactive to light.  Cardiovascular:     Rate and Rhythm: Normal rate and regular rhythm.     Heart sounds: No murmur. No friction rub. No gallop.   Pulmonary:     Effort: Pulmonary effort is normal. No respiratory distress.     Breath sounds: Normal breath sounds. No stridor. No rales.  Abdominal:     General: There is no distension.     Palpations: Abdomen is soft.     Tenderness: There is abdominal tenderness (mild discomfort) in the suprapubic area.  Musculoskeletal:        General: No tenderness.     Cervical back: Normal range of motion and neck supple.  Skin:    General: Skin is warm and dry.     Findings: No erythema or rash.  Neurological:     Mental Status: She is alert and oriented to person, place, and time.     ED Results and Treatments Labs (all labs ordered are listed, but only abnormal results are displayed) Labs Reviewed  BASIC METABOLIC PANEL - Abnormal; Notable for the following components:      Result Value   Potassium 3.2 (*)    All other components within normal limits  CBC - Abnormal; Notable for the following components:   RBC 3.12 (*)    Hemoglobin 10.4 (*)    HCT 31.1 (*)    All other components within normal limits  HCG, QUANTITATIVE, PREGNANCY - Abnormal; Notable for the following components:     hCG, Beta Chain, Quant, S 39,420 (*)    All other components within normal limits  PROTIME-INR  TROPONIN I (HIGH SENSITIVITY)  TROPONIN I (HIGH SENSITIVITY)                                                                                                                         EKG  EKG Interpretation  Date/Time:  Sunday August 31 2019 00:43:36 EST Ventricular Rate:  79 PR Interval:  226 QRS Duration: 96 QT Interval:  374 QTC Calculation: 428 R Axis:   5 Text Interpretation: Sinus rhythm with 1st degree A-V block Low voltage QRS Incomplete right bundle branch block Cannot rule out Anterior infarct , age undetermined Abnormal ECG NO STEMI. No old tracing to compare Confirmed by Drema Pry [phone]) on 08/31/2019 3:14:31 AM      Radiology US OB LESS THAN 14 WEEKS WITH OB TRANSVAGINAL  Result Date: 08/31/2019 CLINICAL DATA:  Positive pregnancy test with pelvic pain. EXAM: OBSTETRIC <14 WK [place] AND TRANSVAGINAL OB US TECHNIQUE: Both transabdominal and transvaginal ultrasound examinations were performed for complete evaluation of the gestation as well as the maternal uterus, adnexal regions, and pelvic cul-de-sac. Transvaginal technique was performed to assess early pregnancy. COMPARISON:  None. FINDINGS: Intrauterine gestational sac: Single. Yolk sac:  Visualized. Embryo:  Visualized. Cardiac Activity: Visualized. Heart Rate: 128 bpm CRL: 8.7 mm   6 w  6 d                  [place] EDC: 04/19/2020 Subchorionic hemorrhage:  Small Maternal uterus/adnexae: Posterior uterine fibroid measures up to 4.3 cm. Hemorrhagic follicle or corpus luteum cyst right ovary. Left ovary unremarkable. No free fluid in the cul-de-sac. IMPRESSION: Single living intrauterine gestation at estimated 6 week 6 day gestational age by crown-rump length. Small subchorionic hemorrhage. Electronically Signed   By: Kennith Center M.D.   On: 08/31/2019 08:09    Pertinent labs & imaging results that were available during my care of  the patient were reviewed by me and considered in my medical decision making (see chart for details).  Medications Ordered in ED Medications  sodium chloride flush (NS) 0.9 % injection 3 mL (has no administration in time range)                                                                                                                                    Procedures Procedures  (including critical care time)  Medical Decision Making / ED Course I have reviewed the nursing notes for this encounter and the patient's prior records (if available in EHR or on provided paperwork).   Emelie C Zimmerman was evaluated in Emergency Department on 08/31/2019 for the symptoms described in the history of present illness. She was evaluated in the context of the global COVID-19 pandemic, which necessitated consideration that the patient might be at risk for infection with the SARS-CoV-2 virus that causes COVID-19. Institutional protocols and algorithms that pertain to the evaluation of patients at risk for COVID-19 are in a state of rapid change based on information released by regulatory bodies including the CDC and federal and state organizations. These policies and algorithms were followed during the patient's care in the ED.  Orthostatic reassuring.  Chest pain appears to be highly atypical and inconsistent with ACS.  EKG without acute ischemic changes or evidence of pericarditis.  Serial troponins negative x2.  Doubt cardiac etiology.  Low suspicion for pulmonary embolism.  Presentation not classic for dissection or esophageal perforation.  Labs grossly reassuring without leukocytosis.  Hemoglobin stable.  No electrolyte significant derangements or renal sufficiency.  Pregnancy confirmed. [place] with IUP       Final Clinical Impression(s) / ED Diagnoses Final diagnoses:  Less than [redacted] weeks gestation of pregnancy  Other depression  Fatigue due to depression  Chest discomfort  Abdominal discomfort     The patient appears reasonably screened and/or stabilized for discharge and I doubt any other medical condition or other [facility] requiring further screening, evaluation, or treatment in the ED at this time prior to discharge. Safe for discharge with strict return precautions.  Disposition: Discharge  Condition: Good  I have discussed the results, Dx and Tx plan with the patient/family who expressed understanding and agree(s) with the plan. Discharge instructions discussed at length. The patient/family was given strict  return precautions who verbalized understanding of the instructions. No further questions at time of discharge.    ED Discharge Orders    None        Follow Up: [facility] [address], [address] [id number] [place] [id number] [phone] Schedule an appointment as soon as possible for a visit    Primary care provider  Schedule an appointment as soon as possible for a visit  If you do not have a primary care physician, contact [facility] at [phone] for referral     This chart was dictated using voice recognition software.  Despite best efforts to proofread,  errors can occur which can change the documentation meaning.   Fatima Blank, MD 08/31/19 [phone]

## 2019-08-31 NOTE — ED Triage Notes (Signed)
Patient is [redacted] weeks pregnant ( no prenatal care) presents with multiple complaints: Chest pain for 2 weeks with "hot flashes" , fatigue , nausea lightheaded and dizziness. Denies abdominal contractions or vaginal bleeding .

## 2019-09-22 DIAGNOSIS — O09519 Supervision of elderly primigravida, unspecified trimester: Secondary | ICD-10-CM | POA: Diagnosis not present

## 2019-09-22 DIAGNOSIS — R300 Dysuria: Secondary | ICD-10-CM | POA: Diagnosis not present

## 2019-09-22 DIAGNOSIS — M549 Dorsalgia, unspecified: Secondary | ICD-10-CM | POA: Diagnosis not present

## 2019-09-22 DIAGNOSIS — R5383 Other fatigue: Secondary | ICD-10-CM | POA: Diagnosis not present

## 2019-09-22 DIAGNOSIS — O021 Missed abortion: Secondary | ICD-10-CM | POA: Diagnosis not present

## 2019-09-22 DIAGNOSIS — O0389 Complete or unspecified spontaneous abortion with other complications: Secondary | ICD-10-CM | POA: Diagnosis not present

## 2019-09-22 DIAGNOSIS — Z3201 Encounter for pregnancy test, result positive: Secondary | ICD-10-CM | POA: Diagnosis not present

## 2019-09-22 DIAGNOSIS — Z3A00 Weeks of gestation of pregnancy not specified: Secondary | ICD-10-CM | POA: Diagnosis not present

## 2019-09-22 DIAGNOSIS — R102 Pelvic and perineal pain: Secondary | ICD-10-CM | POA: Diagnosis not present

## 2019-09-23 ENCOUNTER — Emergency Department (HOSPITAL_BASED_OUTPATIENT_CLINIC_OR_DEPARTMENT_OTHER)
Admission: EM | Admit: 2019-09-23 | Discharge: 2019-09-23 | Disposition: A | Discharge status: Home / Self Care | Payer: Medicaid Other | Attending: Emergency Medicine | Admitting: Emergency Medicine

## 2019-09-23 ENCOUNTER — Other Ambulatory Visit: Payer: Self-pay

## 2019-09-23 ENCOUNTER — Encounter (HOSPITAL_BASED_OUTPATIENT_CLINIC_OR_DEPARTMENT_OTHER): Payer: Self-pay | Admitting: Emergency Medicine

## 2019-09-23 DIAGNOSIS — R109 Unspecified abdominal pain: Secondary | ICD-10-CM | POA: Diagnosis present

## 2019-09-23 DIAGNOSIS — Z711 Person with feared health complaint in whom no diagnosis is made: Secondary | ICD-10-CM

## 2019-09-23 DIAGNOSIS — N938 Other specified abnormal uterine and vaginal bleeding: Secondary | ICD-10-CM | POA: Insufficient documentation

## 2019-09-23 DIAGNOSIS — O26891 Other specified pregnancy related conditions, first trimester: Secondary | ICD-10-CM | POA: Diagnosis not present

## 2019-09-23 DIAGNOSIS — Z3A10 10 weeks gestation of pregnancy: Secondary | ICD-10-CM | POA: Diagnosis not present

## 2019-09-23 DIAGNOSIS — R102 Pelvic and perineal pain: Secondary | ICD-10-CM | POA: Diagnosis not present

## 2019-09-23 DIAGNOSIS — M5489 Other dorsalgia: Secondary | ICD-10-CM | POA: Insufficient documentation

## 2019-09-23 DIAGNOSIS — R350 Frequency of micturition: Secondary | ICD-10-CM | POA: Diagnosis not present

## 2019-09-23 LAB — PREGNANCY, URINE: Preg Test, Ur: POSITIVE — AB

## 2019-09-23 LAB — URINALYSIS, ROUTINE W REFLEX MICROSCOPIC
Bilirubin Urine: NEGATIVE
Glucose, UA: NEGATIVE mg/dL
Hgb urine dipstick: NEGATIVE
Ketones, ur: NEGATIVE mg/dL
Leukocytes,Ua: NEGATIVE
Nitrite: NEGATIVE
Protein, ur: NEGATIVE mg/dL
Specific Gravity, Urine: >1.03 — ABNORMAL HIGH (ref 1.005–1.030)
pH: 6 (ref 5.0–8.0)

## 2019-09-23 MED ORDER — KETOROLAC TROMETHAMINE 30 MG/ML IJ SOLN
INTRAMUSCULAR | Status: AC
  Filled 2019-09-23: qty 1

## 2019-09-23 MED ORDER — KETOROLAC TROMETHAMINE 30 MG/ML IJ SOLN
30.0000 mg | Freq: Once | INTRAMUSCULAR | Status: AC
  Administered 2019-09-23: 30 mg via INTRAMUSCULAR

## 2019-09-23 MED ORDER — DOXYCYCLINE HYCLATE 100 MG PO CAPS: 100.0000 mg | ORAL_CAPSULE | Freq: Two times a day (BID) | ORAL | 0 refills | Status: DC

## 2019-09-23 MED ORDER — DOXYCYCLINE HYCLATE 100 MG PO TABS
100.0000 mg | ORAL_TABLET | Freq: Once | ORAL | Status: AC
  Administered 2019-09-23: 100 mg via ORAL
  Filled 2019-09-23: qty 1

## 2019-09-23 MED ORDER — NAPROXEN 500 MG PO TABS: 500.0000 mg | ORAL_TABLET | Freq: Two times a day (BID) | ORAL | 0 refills | Status: DC

## 2019-09-23 MED ORDER — CEFTRIAXONE SODIUM 500 MG IJ SOLR
500.0000 mg | Freq: Once | INTRAMUSCULAR | Status: AC
  Administered 2019-09-23: 500 mg via INTRAMUSCULAR
  Filled 2019-09-23: qty 500

## 2019-09-23 NOTE — Discharge Instructions (Addendum)
You were seen today for ongoing pelvic pain.  Some of this may be normal following your abortion.  I would avoid any foreign objects in your vagina to see if this helps with your ongoing cramping.  This includes sexual intercourse and tampons.  You were tested and treated for STDs.  Finish your course of antibiotics.  Take naproxen twice daily for any discomfort.  Make sure to use birth control when you resume sexual activity.

## 2019-09-23 NOTE — ED Provider Notes (Signed)
[facility] EMERGENCY DEPARTMENT Provider Note   CSN: [id number] Arrival date & time: 09/23/19  0031     History Chief Complaint  Patient presents with  . Abdominal Pain    Gabrielle Zimmerman is a 39 y.o. female.  HPI     This is a 39 year old G2P3 female who presents with pelvic pain, urinary frequency and urgency.  Patient reports that she had a medical abortion last Tuesday.  She was approximately [redacted] weeks pregnant.  She took 1 dose of misoprostol in the clinic and repeated that dose 24 hours later on Wednesday.  She reports she had onset of significant cramping and heavy bleeding.  She does believe that she passed products of conception.  She has had 3 prior abortions.  Patient states that she had some persistent bleeding but that that has improved and just is now light spotting.  However on Saturday she developed urinary frequency and urgency.  She also states that she is having fairly significant back and pelvic pain.  Pain does not seem to be exacerbated by anything specific.  She has taken ibuprofen with minimal relief.  She has been using tampons for the last several days and resumed sexual activity last night.  She has a NuvaRing in place.  She believed that she had a urinary tract infection.  She initially had a virtual visit yesterday but was told she was too high risk to be evaluated virtually.  She states that she subsequently went to urgent care and provided a urine sample and was told that she did not have a urinary tract infection and that she needs to be seen in the ED.  She then went to [facility] in [place].  She was not officially evaluated but did have screening lab work and an ultrasound.  I have reviewed those and the results are below.  Patient denies any fevers.  She denies any nausea or vomiting.  Reports normal bowel movements.  Patient has a partner at the bedside and reports that she is doing "okay."  She has a depressed affect but reports that  she has good support.  Chart review from [place], [facility] B HCG Quant 2,455   US Ob < 14 Wks Transabdominal/Transvaginal (09/22/2019 9:54 PM EDT) US Ob < 14 Wks Transabdominal/Transvaginal (09/22/2019 9:54 PM EDT)  Specimen     US Ob < 14 Wks Transabdominal/Transvaginal (09/22/2019 9:54 PM EDT)  Impressions Performed At  IMPRESSION:    1. Negative for intrauterine gestational sac. The patient presumably had an abortion 2 days ago by clinical history. Consider correlation with a short-term interval follow-up pelvic ultrasound in one to 2 weeks with repeat beta hCG levels if clinically indicated.  2. No convincing evidence for ectopic pregnancy.  3. Mild free fluid in the cul-de-sac.    Electronically Signed by: Lambert Keto      Past Medical History:  Diagnosis Date  . Anemia     There are no problems to display for this patient.   History reviewed. No pertinent surgical history.   OB History    Gravida  1   Para      Term      Preterm      AB      Living        SAB      TAB      Ectopic      Multiple      Live Births  Family History  Problem Relation Age of Onset  . Healthy Mother   . Diabetes Father     Social History   Tobacco Use  . Smoking status: Never Smoker  . Smokeless tobacco: Never Used  Substance Use Topics  . Alcohol use: Yes  . Drug use: Not on file    Home Medications Prior to Admission medications   Medication Sig Start Date End Date Taking? Authorizing Provider  doxycycline (VIBRAMYCIN) 100 MG capsule Take 1 capsule (100 mg total) by mouth 2 (two) times daily. 09/23/19   Clancy Leiner, Barbette Hair, MD  naproxen (NAPROSYN) 500 MG tablet Take 1 tablet (500 mg total) by mouth 2 (two) times daily. 09/23/19   Santiago Stenzel, Barbette Hair, MD    Allergies    Patient has no known allergies.  Review of Systems   Review of Systems  Constitutional: Negative for fever.  Respiratory: Negative for shortness of  breath.   Cardiovascular: Negative for chest pain.  Gastrointestinal: Positive for abdominal pain. Negative for constipation, diarrhea, nausea and vomiting.  Genitourinary: Positive for frequency, pelvic pain, urgency and vaginal bleeding. Negative for dysuria.  All other systems reviewed and are negative.   Physical Exam Updated Vital Signs BP 121/88 (BP Location: Right Arm)   Pulse 66   Temp 97.8 F (36.6 C) (Oral)   Resp 16   Ht 1.676 m (5\' 6" )   Wt 75 kg   LMP 07/17/2019   SpO2 100%   BMI 26.69 kg/m   Physical Exam Vitals and nursing note reviewed.  Constitutional:      Appearance: She is well-developed. She is not ill-appearing.  HENT:     Head: Normocephalic and atraumatic.  Eyes:     Pupils: Pupils are equal, round, and reactive to light.  Cardiovascular:     Rate and Rhythm: Normal rate and regular rhythm.  Pulmonary:     Effort: Pulmonary effort is normal. No respiratory distress.  Abdominal:     Palpations: Abdomen is soft.     Tenderness: There is no abdominal tenderness. There is no guarding or rebound.  Genitourinary:    Comments: Normal external vaginal exam, scant blood in the vaginal vault, cervical os closed, birth control ring in place, no adnexal or cervical motion tenderness or mass Musculoskeletal:     Cervical back: Neck supple.  Skin:    General: Skin is warm and dry.  Neurological:     Mental Status: She is alert and oriented to person, place, and time.  Psychiatric:        Mood and Affect: Mood is depressed.     ED Results / Procedures / Treatments   Labs (all labs ordered are listed, but only abnormal results are displayed) Labs Reviewed  PREGNANCY, URINE - Abnormal; Notable for the following components:      Result Value   Preg Test, Ur POSITIVE (*)    All other components within normal limits  URINALYSIS, ROUTINE W REFLEX MICROSCOPIC - Abnormal; Notable for the following components:   Specific Gravity, Urine >1.030 (*)    All  other components within normal limits  GC/CHLAMYDIA PROBE AMP ([place]) NOT AT [facility]    EKG None  Radiology No results found.  Procedures Procedures (including critical care time)  Medications Ordered in ED Medications  cefTRIAXone (ROCEPHIN) injection 500 mg (has no administration in time range)  doxycycline (VIBRA-TABS) tablet 100 mg (has no administration in time range)  ketorolac (TORADOL) 30 MG/ML injection 30 mg (30 mg Intramuscular Given  09/23/19 0106)    ED Course  I have reviewed the triage vital signs and the nursing notes.  Pertinent labs & imaging results that were available during my care of the patient were reviewed by me and considered in my medical decision making (see chart for details).    MDM Rules/Calculators/A&P                       Patient presents with pelvic pain and back pain.  Recent medical abortion.  I have reviewed her outside records which show downtrending beta-hCG and no IUP in the uterus.  Prior ultrasound at the end of February did show an IUP.  This likely represents successful abortion.  She is overall nontoxic and vital signs are reassuring.  Exam is relatively nontender.  She has no masses on vaginal exam.  She has been using tampons and did resume sexual activity.  While this is likely low risk given that she had a medical abortion, I suspect that this could be exacerbating some cramping.  Recommend that she forego tampons and sexual activity until her symptoms improve.  Urinalysis here does not show any obvious infection.  She was tested and treated for STDs although she does not have any symptoms.  She does state that her partner has ongoing "irritation" for which she has tested negative multiple times.  Will discharge with doxycycline.  Recommend close follow-up if symptoms do not resolve.  Patient had improvement with Toradol in the ED.  Will discharge with naproxen for symptom management.  After history, exam, and medical workup I feel  the patient has been appropriately medically screened and is safe for discharge home. Pertinent diagnoses were discussed with the patient. Patient was given return precautions.   Final Clinical Impression(s) / ED Diagnoses Final diagnoses:  Pelvic pain in female  Concern about STD in female without diagnosis    Rx / DC Orders ED Discharge Orders         Ordered    doxycycline (VIBRAMYCIN) 100 MG capsule  2 times daily     09/23/19 0141    naproxen (NAPROSYN) 500 MG tablet  2 times daily     09/23/19 0141           Ennifer Harston, Mayer Masker, MD 09/23/19 [phone]

## 2019-09-23 NOTE — ED Triage Notes (Signed)
Patient presents with complaints of pelvic and lower back pain onset 2-3 days ago; states took an "abortion pill" Tuesday and Wednesday of last week.

## 2019-09-24 LAB — GC/CHLAMYDIA PROBE AMP (~~LOC~~) NOT AT ARMC
Chlamydia: NEGATIVE
Neisseria Gonorrhea: NEGATIVE

## 2020-04-29 ENCOUNTER — Encounter (HOSPITAL_BASED_OUTPATIENT_CLINIC_OR_DEPARTMENT_OTHER): Payer: Self-pay | Admitting: Radiology

## 2020-04-29 ENCOUNTER — Other Ambulatory Visit: Payer: Self-pay

## 2020-04-29 ENCOUNTER — Emergency Department (HOSPITAL_BASED_OUTPATIENT_CLINIC_OR_DEPARTMENT_OTHER)
Admission: EM | Admit: 2020-04-29 | Discharge: 2020-04-29 | Disposition: A | Discharge status: Home / Self Care | Payer: Medicaid Other | Attending: Emergency Medicine | Admitting: Emergency Medicine

## 2020-04-29 ENCOUNTER — Emergency Department (HOSPITAL_BASED_OUTPATIENT_CLINIC_OR_DEPARTMENT_OTHER): Payer: Medicaid Other

## 2020-04-29 ENCOUNTER — Encounter (HOSPITAL_BASED_OUTPATIENT_CLINIC_OR_DEPARTMENT_OTHER): Payer: Self-pay | Admitting: Emergency Medicine

## 2020-04-29 ENCOUNTER — Other Ambulatory Visit (HOSPITAL_BASED_OUTPATIENT_CLINIC_OR_DEPARTMENT_OTHER): Payer: Self-pay | Admitting: Emergency Medicine

## 2020-04-29 DIAGNOSIS — R1031 Right lower quadrant pain: Secondary | ICD-10-CM | POA: Diagnosis present

## 2020-04-29 DIAGNOSIS — N83202 Unspecified ovarian cyst, left side: Secondary | ICD-10-CM

## 2020-04-29 DIAGNOSIS — N83201 Unspecified ovarian cyst, right side: Secondary | ICD-10-CM | POA: Diagnosis not present

## 2020-04-29 LAB — CBC WITH DIFFERENTIAL/PLATELET
Abs Immature Granulocytes: 0.01 10*3/uL (ref 0.00–0.07)
Basophils Absolute: 0 10*3/uL (ref 0.0–0.1)
Basophils Relative: 1 %
Eosinophils Absolute: 0.1 10*3/uL (ref 0.0–0.5)
Eosinophils Relative: 1 %
HCT: 36.3 % (ref 36.0–46.0)
Hemoglobin: 12.2 g/dL (ref 12.0–15.0)
Immature Granulocytes: 0 %
Lymphocytes Relative: 27 %
Lymphs Abs: 1.6 10*3/uL (ref 0.7–4.0)
MCH: 33.6 pg (ref 26.0–34.0)
MCHC: 33.6 g/dL (ref 30.0–36.0)
MCV: 100 fL (ref 80.0–100.0)
Monocytes Absolute: 0.4 10*3/uL (ref 0.1–1.0)
Monocytes Relative: 6 %
Neutro Abs: 3.7 10*3/uL (ref 1.7–7.7)
Neutrophils Relative %: 65 %
Platelets: 243 10*3/uL (ref 150–400)
RBC: 3.63 MIL/uL — ABNORMAL LOW (ref 3.87–5.11)
RDW: 12.1 % (ref 11.5–15.5)
WBC: 5.7 10*3/uL (ref 4.0–10.5)
nRBC: 0 % (ref 0.0–0.2)

## 2020-04-29 LAB — URINALYSIS, ROUTINE W REFLEX MICROSCOPIC
Bilirubin Urine: NEGATIVE
Glucose, UA: NEGATIVE mg/dL
Hgb urine dipstick: NEGATIVE
Ketones, ur: 15 mg/dL — AB
Leukocytes,Ua: NEGATIVE
Nitrite: NEGATIVE
Protein, ur: NEGATIVE mg/dL
Specific Gravity, Urine: 1.025 (ref 1.005–1.030)
pH: 6 (ref 5.0–8.0)

## 2020-04-29 LAB — COMPREHENSIVE METABOLIC PANEL
ALT: 15 U/L (ref 0–44)
AST: 21 U/L (ref 15–41)
Albumin: 3.8 g/dL (ref 3.5–5.0)
Alkaline Phosphatase: 30 U/L — ABNORMAL LOW (ref 38–126)
Anion gap: 10 (ref 5–15)
BUN: 13 mg/dL (ref 6–20)
CO2: 23 mmol/L (ref 22–32)
Calcium: 8.8 mg/dL — ABNORMAL LOW (ref 8.9–10.3)
Chloride: 104 mmol/L (ref 98–111)
Creatinine, Ser: 0.94 mg/dL (ref 0.44–1.00)
GFR, Estimated: >60 mL/min (ref >60)
Glucose, Bld: 108 mg/dL — ABNORMAL HIGH (ref 70–99)
Potassium: 3.8 mmol/L (ref 3.5–5.1)
Sodium: 137 mmol/L (ref 135–145)
Total Bilirubin: 0.2 mg/dL — ABNORMAL LOW (ref 0.3–1.2)
Total Protein: 7 g/dL (ref 6.5–8.1)

## 2020-04-29 LAB — LIPASE, BLOOD: Lipase: 20 U/L (ref 11–51)

## 2020-04-29 LAB — PREGNANCY, URINE: Preg Test, Ur: NEGATIVE

## 2020-04-29 MED ORDER — IOHEXOL 300 MG/ML  SOLN
100.0000 mL | Freq: Once | INTRAMUSCULAR | Status: AC | PRN
  Administered 2020-04-29: 100 mL via INTRAVENOUS

## 2020-04-29 MED ORDER — ONDANSETRON 4 MG PO TBDP: ORAL_TABLET | ORAL | 0 refills | Status: DC

## 2020-04-29 MED ORDER — MORPHINE SULFATE 15 MG PO TABS: 7.5000 mg | ORAL_TABLET | ORAL | 0 refills | Status: DC | PRN

## 2020-04-29 MED ORDER — MORPHINE SULFATE (PF) 4 MG/ML IV SOLN
4.0000 mg | Freq: Once | INTRAVENOUS | Status: AC
  Administered 2020-04-29: 4 mg via INTRAVENOUS
  Filled 2020-04-29: qty 1

## 2020-04-29 MED ORDER — KETOROLAC TROMETHAMINE 15 MG/ML IJ SOLN
15.0000 mg | Freq: Once | INTRAMUSCULAR | Status: AC
  Administered 2020-04-29: 15 mg via INTRAVENOUS
  Filled 2020-04-29: qty 1

## 2020-04-29 MED ORDER — ONDANSETRON HCL 4 MG/2ML IJ SOLN
4.0000 mg | Freq: Once | INTRAMUSCULAR | Status: AC
  Administered 2020-04-29: 4 mg via INTRAVENOUS
  Filled 2020-04-29: qty 2

## 2020-04-29 NOTE — ED Provider Notes (Signed)
[facility] EMERGENCY DEPARTMENT Provider Note   CSN: [id number] Arrival date & time: 04/29/20  2500     History Chief Complaint  Patient presents with  . Abdominal Pain    Gabrielle Zimmerman is a 39 y.o. female.  39 yo F with a chief complaints of right lower quadrant abdominal pain that radiates to her right flank.  This is been going on for the past couple days.  She went to a emergency department outside of [facility] yesterday and had a CT scan and an ultrasound and a pelvic exam.  Was told that she had a ruptured left ovarian cyst.  Was told that she should follow-up with her OB/GYN in the office.  Since then the patient feels like her pain is gotten significantly worse.  She has had some nausea and vomiting but denies fevers denies diarrhea.  Still having severe crampy pain in the right lower quadrant.  Denies any vaginal bleeding or discharge.  The history is provided by the patient.  Abdominal Pain Pain location:  RLQ Pain quality: sharp and shooting   Pain radiates to:  Does not radiate Pain severity:  Moderate Onset quality:  Gradual Duration:  2 days Timing:  Constant Progression:  Worsening Chronicity:  New Relieved by:  Nothing Worsened by:  Nothing Ineffective treatments:  None tried Associated symptoms: nausea and vomiting   Associated symptoms: no chest pain, no chills, no dysuria, no fever and no shortness of breath        Past Medical History:  Diagnosis Date  . Anemia     There are no problems to display for this patient.   Past Surgical History:  Procedure Laterality Date  . LEEP       OB History    Gravida  1   Para      Term      Preterm      AB      Living        SAB      TAB      Ectopic      Multiple      Live Births              Family History  Problem Relation Age of Onset  . Healthy Mother   . Diabetes Father     Social History   Tobacco Use  . Smoking status: Never Smoker  . Smokeless  tobacco: Never Used  Vaping Use  . Vaping Use: Never used  Substance Use Topics  . Alcohol use: Yes    Comment: weekends  . Drug use: Never    Home Medications Prior to Admission medications   Medication Sig Start Date End Date Taking? Authorizing Provider  etonogestrel-ethinyl estradiol (NUVARING) 0.12-0.015 MG/24HR vaginal ring  02/03/20   [provider]  morphine (MSIR) 15 MG tablet Take 0.5 tablets (7.5 mg total) by mouth every 4 (four) hours as needed for severe pain. 04/29/20   Melene Plan, DO  ondansetron (ZOFRAN ODT) 4 MG disintegrating tablet 4mg  ODT q4 hours prn nausea/vomit 04/29/20   05/01/20, DO    Allergies    Patient has no known allergies.  Review of Systems   Review of Systems  Constitutional: Negative for chills and fever.  HENT: Negative for congestion and rhinorrhea.   Eyes: Negative for redness and visual disturbance.  Respiratory: Negative for shortness of breath and wheezing.   Cardiovascular: Negative for chest pain and palpitations.  Gastrointestinal: Positive for abdominal pain,  nausea and vomiting.  Genitourinary: Negative for dysuria and urgency.  Musculoskeletal: Negative for arthralgias and myalgias.  Skin: Negative for pallor and wound.  Neurological: Negative for dizziness and headaches.    Physical Exam Updated Vital Signs BP 117/65   Pulse 74   Temp 98.4 F (36.9 C) (Oral)   Resp 18   Ht 5\' 6"  (1.676 m)   Wt 80.2 kg   LMP 04/13/2020   SpO2 99%   BMI 28.54 kg/m   Physical Exam Vitals and nursing note reviewed.  Constitutional:      General: She is not in acute distress.    Appearance: She is well-developed. She is not diaphoretic.  HENT:     Head: Normocephalic and atraumatic.  Eyes:     Pupils: Pupils are equal, round, and reactive to light.  Cardiovascular:     Rate and Rhythm: Normal rate and regular rhythm.     Heart sounds: No murmur heard.  No friction rub. No gallop.   Pulmonary:     Effort: Pulmonary  effort is normal.     Breath sounds: No wheezing or rales.  Abdominal:     General: There is no distension.     Palpations: Abdomen is soft.     Tenderness: There is abdominal tenderness in the right lower quadrant.     Comments: Diffusely tender but worse in the right lower quadrant  Musculoskeletal:        General: No tenderness.     Cervical back: Normal range of motion and neck supple.  Skin:    General: Skin is warm and dry.  Neurological:     Mental Status: She is alert and oriented to person, place, and time.  Psychiatric:        Behavior: Behavior normal.     ED Results / Procedures / Treatments   Labs (all labs ordered are listed, but only abnormal results are displayed) Labs Reviewed  CBC WITH DIFFERENTIAL/PLATELET - Abnormal; Notable for the following components:      Result Value   RBC 3.63 (*)    All other components within normal limits  COMPREHENSIVE METABOLIC PANEL - Abnormal; Notable for the following components:   Glucose, Bld 108 (*)    Calcium 8.8 (*)    Alkaline Phosphatase 30 (*)    Total Bilirubin 0.2 (*)    All other components within normal limits  URINALYSIS, ROUTINE W REFLEX MICROSCOPIC - Abnormal; Notable for the following components:   APPearance HAZY (*)    Ketones, ur 15 (*)    All other components within normal limits  LIPASE, BLOOD  PREGNANCY, URINE    EKG None  Radiology CT ABDOMEN PELVIS W CONTRAST  Result Date: 04/29/2020 CLINICAL DATA:  Right lower quadrant abdominal pain EXAM: CT ABDOMEN AND PELVIS WITH CONTRAST TECHNIQUE: Multidetector CT imaging of the abdomen and pelvis was performed using the standard protocol following bolus administration of intravenous contrast. CONTRAST:  100mL OMNIPAQUE IOHEXOL 300 MG/ML SOLN, back and abdominal pain, nausea COMPARISON:  None. FINDINGS: Lower chest: No acute abnormality. Hepatobiliary: No solid liver abnormality is seen. Small gallstone in the gallbladder. No gallbladder wall thickening,  or biliary dilatation. Pancreas: Unremarkable. No pancreatic ductal dilatation or surrounding inflammatory changes. Spleen: Normal in size without significant abnormality. Adrenals/Urinary Tract: Adrenal glands are unremarkable. Kidneys are normal, without renal calculi, solid lesion, or hydronephrosis. Thickening of the urinary bladder. Stomach/Bowel: Stomach is within normal limits. Appendix appears normal (series 2, image 48). No evidence of bowel wall  thickening, distention, or inflammatory changes. Vascular/Lymphatic: No significant vascular findings are present. No enlarged abdominal or pelvic lymph nodes. Reproductive: Fluid in the endometrial cavity. Multiple uterine fibroids, including an exophytic or pedunculated fibroid of the fundus measuring 3.3 cm. There is a cystic lesion arising from the left ovary or adnexa measuring 8.1 x 8.0 x 7.2 cm (series 2, image 65). Other: No abdominal wall hernia or abnormality. No abdominopelvic ascites. Musculoskeletal: No acute or significant osseous findings. IMPRESSION: 1. There is a cystic lesion which appears to arise from the left ovary or adnexa measuring 8.1 cm. This is most likely a large ovarian cyst, and although almost certainly benign and functional in nature given patient age, may be symptomatic due to size or serve as a nidus of ovarian torsion if acutely referable symptoms are present. Recommend pelvic ultrasound to further evaluate. 2. Thickening of the urinary bladder, suggestive of nonspecific infectious or inflammatory cystitis. Correlate with urinalysis. 3. Multiple uterine fibroids. 4. Cholelithiasis without evidence of acute cholecystitis. 5. Normal appendix. Electronically Signed   By: Lauralyn Primes M.D.   On: 04/29/2020 08:41    Procedures Procedures (including critical care time)  Medications Ordered in ED Medications  ketorolac (TORADOL) 15 MG/ML injection 15 mg (has no administration in time range)  morphine 4 MG/ML injection 4 mg (4 mg  Intravenous Given 04/29/20 0740)  ondansetron (ZOFRAN) injection 4 mg (4 mg Intravenous Given 04/29/20 0739)  iohexol (OMNIPAQUE) 300 MG/ML solution 100 mL (100 mLs Intravenous Contrast Given 04/29/20 0813)    ED Course  I have reviewed the triage vital signs and the nursing notes.  Pertinent labs & imaging results that were available during my care of the patient were reviewed by me and considered in my medical decision making (see chart for details).    MDM Rules/Calculators/A&P                          39 yo F with a chief complaints of right lower quadrant abdominal pain.  This been going on for the past couple days.  Went to an outside ED and had reportedly CT imaging and an ultrasound.  Was told that she had a ruptured left-sided ovarian cyst.  Seems to have pain continued off and on to the right lower quadrant.  I unfortunately am unable to access those results through care everywhere.  I am unsure exactly what had occurred.  I discussed this with her and offered multiple options including treating the patient's symptoms and having her follow-up with OB/GYN.  She is electing for repeat lab work and CT imaging.  I did discuss with her the risks of radiation.  CT scan is consistent with what she had told me from her visit at the outside ED.  She has a very large left-sided ovarian cyst.  This is fairly large on my view of the CT and does extend onto the right side which could be the cause of her back and right-sided abdominal pain.  She had an ultrasound done at the other hospital.  I feel this is unlikely to be right-sided torsion if the cyst is on the left.  She does not have OB/GYN follow-up in the area.  I will change her pain regimen.  Lab work without leukocytosis no significant LFT elevation.  UA was negative for infection.  Her pain is significantly better controlled now than when she arrived.  9:14 AM:  I have discussed the diagnosis/risks/treatment options with  the patient and family  and believe the pt to be eligible for discharge home to follow-up with GYN. We also discussed returning to the ED immediately if new or worsening sx occur. We discussed the sx which are most concerning (e.g., sudden worsening pain, fever, inability to tolerate by mouth) that necessitate immediate return. Medications administered to the patient during their visit and any new prescriptions provided to the patient are listed below.  Medications given during this visit Medications  ketorolac (TORADOL) 15 MG/ML injection 15 mg (has no administration in time range)  morphine 4 MG/ML injection 4 mg (4 mg Intravenous Given 04/29/20 0740)  ondansetron (ZOFRAN) injection 4 mg (4 mg Intravenous Given 04/29/20 0739)  iohexol (OMNIPAQUE) 300 MG/ML solution 100 mL (100 mLs Intravenous Contrast Given 04/29/20 0813)     The patient appears reasonably screen and/or stabilized for discharge and I doubt any other medical condition or other [facility] requiring further screening, evaluation, or treatment in the ED at this time prior to discharge.   Final Clinical Impression(s) / ED Diagnoses Final diagnoses:  Cyst of left ovary    Rx / DC Orders ED Discharge Orders         Ordered    morphine (MSIR) 15 MG tablet  Every 4 hours PRN        04/29/20 0909    ondansetron (ZOFRAN ODT) 4 MG disintegrating tablet        04/29/20 0909           Melene Plan, DO 04/29/20 [id number]

## 2020-04-29 NOTE — Discharge Instructions (Addendum)
Take 4 over the counter ibuprofen tablets 3 times a day or 2 over-the-counter naproxen tablets twice a day for pain. Also take tylenol 1000mg (2 extra strength) four times a day.   Return for fever, vomiting, worsening pain.   Then take the pain medicine if you feel like you need it. Narcotics do not help with the pain, they only make you care about it less.  You can become addicted to this, people may break into your house to steal it.  It will constipate you.  If you drive under the influence of this medicine you can get a DUI.

## 2020-04-29 NOTE — ED Triage Notes (Signed)
Low abdominal pain.  Was seen in ED at [facility] yesterday.  No change in pain.

## 2020-04-29 NOTE — ED Notes (Signed)
ED Provider at bedside. 

## 2020-05-07 ENCOUNTER — Other Ambulatory Visit: Payer: Self-pay

## 2020-05-07 ENCOUNTER — Emergency Department (HOSPITAL_BASED_OUTPATIENT_CLINIC_OR_DEPARTMENT_OTHER): Payer: Medicaid Other

## 2020-05-07 ENCOUNTER — Encounter (HOSPITAL_BASED_OUTPATIENT_CLINIC_OR_DEPARTMENT_OTHER): Payer: Self-pay

## 2020-05-07 ENCOUNTER — Emergency Department (HOSPITAL_BASED_OUTPATIENT_CLINIC_OR_DEPARTMENT_OTHER)
Admission: EM | Admit: 2020-05-07 | Discharge: 2020-05-07 | Disposition: A | Discharge status: Home / Self Care | Payer: Medicaid Other | Attending: Emergency Medicine | Admitting: Emergency Medicine

## 2020-05-07 DIAGNOSIS — K5900 Constipation, unspecified: Secondary | ICD-10-CM | POA: Diagnosis present

## 2020-05-07 HISTORY — DX: Unspecified ovarian cyst, unspecified side: N83.209

## 2020-05-07 LAB — PREGNANCY, URINE: Preg Test, Ur: NEGATIVE

## 2020-05-07 MED ORDER — FLEET ENEMA 7-19 GM/118ML RE ENEM
1.0000 | ENEMA | Freq: Once | RECTAL | Status: AC
  Administered 2020-05-07: 1 via RECTAL
  Filled 2020-05-07: qty 1

## 2020-05-07 NOTE — ED Triage Notes (Addendum)
Pt c/o "opiate related constipation"-reports last normal BM 2 weeks ago with small hard stool 3 days ago-NAD-to triage in w/c

## 2020-05-07 NOTE — ED Notes (Signed)
Pt returned from xray

## 2020-05-07 NOTE — ED Notes (Signed)
Patient transported to X-ray 

## 2020-05-07 NOTE — ED Notes (Signed)
Discharge instructions discussed with patient. Verbalized understanding. Departs ED at this time in stable condition.  

## 2020-05-07 NOTE — ED Provider Notes (Signed)
[facility] EMERGENCY DEPARTMENT Provider Note   CSN: [id number] Arrival date & time: 05/07/20  1909     History Chief Complaint  Patient presents with  . Constipation    Gabrielle Zimmerman is a 39 y.o. female.  Patient is a 39 year old female who presents with constipation.  She was recently diagnosed with a large left ovarian cyst.  She has been taking opioid pain medications for this.  She says that she is having constipation related to the opioids.  She feels like there is a large amount of stool in her rectum and is having a lot of pressure there.  Her last bowel movement was about 3 or 4 days ago.  She denies any nausea or vomiting.  No fevers.  No urinary symptoms.  No blood in her stool.        Past Medical History:  Diagnosis Date  . Anemia   . Ovarian cyst     There are no problems to display for this patient.   Past Surgical History:  Procedure Laterality Date  . LEEP       OB History    Gravida  1   Para      Term      Preterm      AB      Living        SAB      TAB      Ectopic      Multiple      Live Births              Family History  Problem Relation Age of Onset  . Healthy Mother   . Diabetes Father     Social History   Tobacco Use  . Smoking status: Never Smoker  . Smokeless tobacco: Never Used  Vaping Use  . Vaping Use: Never used  Substance Use Topics  . Alcohol use: Yes    Comment: weekends  . Drug use: Never    Home Medications Prior to Admission medications   Medication Sig Start Date End Date Taking? Authorizing Provider  etonogestrel-ethinyl estradiol (NUVARING) 0.12-0.015 MG/24HR vaginal ring  02/03/20   [provider]  morphine (MSIR) 15 MG tablet Take 0.5 tablets (7.5 mg total) by mouth every 4 (four) hours as needed for severe pain. 04/29/20   Melene Plan, DO  ondansetron (ZOFRAN ODT) 4 MG disintegrating tablet 4mg  ODT q4 hours prn nausea/vomit 04/29/20   05/01/20, DO     Allergies    Patient has no known allergies.  Review of Systems   Review of Systems  Constitutional: Negative for chills, diaphoresis, fatigue and fever.  HENT: Negative for congestion, rhinorrhea and sneezing.   Eyes: Negative.   Respiratory: Negative for cough, chest tightness and shortness of breath.   Cardiovascular: Negative for chest pain and leg swelling.  Gastrointestinal: Positive for abdominal pain, constipation and rectal pain. Negative for blood in stool, diarrhea, nausea and vomiting.  Genitourinary: Negative for difficulty urinating, flank pain, frequency and hematuria.  Musculoskeletal: Negative for arthralgias and back pain.  Skin: Negative for rash.  Neurological: Negative for dizziness, speech difficulty, weakness, numbness and headaches.    Physical Exam Updated Vital Signs BP 115/74 (BP Location: Left Arm)   Pulse 81   Temp 98.4 F (36.9 C) (Oral)   Resp 18   Ht 5\' 6"  (1.676 m)   Wt 77.1 kg   LMP 04/13/2020   SpO2 99%   BMI 27.44 kg/m  Physical Exam Constitutional:      Appearance: She is well-developed.  HENT:     Head: Normocephalic and atraumatic.  Eyes:     Pupils: Pupils are equal, round, and reactive to light.  Cardiovascular:     Rate and Rhythm: Normal rate and regular rhythm.     Heart sounds: Normal heart sounds.  Pulmonary:     Effort: Pulmonary effort is normal. No respiratory distress.     Breath sounds: Normal breath sounds. No wheezing or rales.  Chest:     Chest wall: No tenderness.  Abdominal:     General: Bowel sounds are normal.     Palpations: Abdomen is soft.     Tenderness: There is abdominal tenderness (Generalized). There is no guarding or rebound.  Genitourinary:    Comments: No enlarged hemorrhoids.  No impaction, no specific area of rectal tenderness Musculoskeletal:        General: Normal range of motion.     Cervical back: Normal range of motion and neck supple.  Lymphadenopathy:     Cervical: No cervical  adenopathy.  Skin:    General: Skin is warm and dry.     Findings: No rash.  Neurological:     Mental Status: She is alert and oriented to person, place, and time.     ED Results / Procedures / Treatments   Labs (all labs ordered are listed, but only abnormal results are displayed) Labs Reviewed  PREGNANCY, URINE    EKG None  Radiology DG ABD ACUTE 2+V W 1V CHEST  Result Date: 05/07/2020 CLINICAL DATA:  Opioid related constipation with abdominal pain EXAM: DG ABDOMEN ACUTE WITH 1 VIEW CHEST COMPARISON:  04/29/2020 FINDINGS: Cardiac shadow is within normal limits. Bilateral nipple shadows are seen. The lungs are well aerated bilaterally. No focal infiltrate is seen. No bony abnormality is noted. Scattered large and small bowel gas is noted. Mild retained fecal material is noted primarily within the rectum consistent with a degree of constipation. No free air is seen. No obstructive changes are noted. No bony abnormality is seen. IMPRESSION: Mild constipation primarily in the rectum. Electronically Signed   By: Alcide Clever M.D.   On: 05/07/2020 21:29    Procedures Procedures (including critical care time)  Medications Ordered in ED Medications  sodium phosphate (FLEET) 7-19 GM/118ML enema 1 enema (1 enema Rectal Given 05/07/20 2216)    ED Course  I have reviewed the triage vital signs and the nursing notes.  Pertinent labs & imaging results that were available during my care of the patient were reviewed by me and considered in my medical decision making (see chart for details).    MDM Rules/Calculators/A&P                          Patient is a 39 year old female who presents with constipation.  X-ray reveals stool primarily in the rectum.  She was given enema and feels much better after that.  She says overall her pain associated with ovarian cyst has been improving.  She does not have any fevers or other change in symptoms.  She has an appointment to follow-up with a  gynecologist on November 12.  She was advised to take a stool softener daily and minimize the amount of morphine that she is taking.  Return precautions were given. Final Clinical Impression(s) / ED Diagnoses Final diagnoses:  Constipation, unspecified constipation type    Rx / DC Orders ED Discharge Orders  None       Rolan Bucco, MD 05/07/20 2259

## 2020-06-04 ENCOUNTER — Other Ambulatory Visit: Payer: Self-pay | Admitting: *Deleted

## 2020-06-04 ENCOUNTER — Other Ambulatory Visit: Payer: Self-pay

## 2020-06-04 DIAGNOSIS — Z0000 Encounter for general adult medical examination without abnormal findings: Secondary | ICD-10-CM

## 2020-06-04 NOTE — Patient Outreach (Addendum)
Care Coordination - Case Manager  06/04/2020  Gabrielle Zimmerman 1981/01/12 [id number]  Subjective:  Gabrielle Zimmerman is an 39 y.o. year old female who is a primary patient of Patient, No Pcp Per.  Ms. Gabrielle Zimmerman was given information about Medicaid Managed Care team care coordination services today. Gabrielle Zimmerman agreed to services and verbal consent obtained  Review of patient status, laboratory and other test data was performed as part of evaluation for provision of services.  SDOH: SDOH Screenings   Alcohol Screen:   . Last Alcohol Screening Score (AUDIT): Not on file  Depression (PHQ2-9):   . PHQ-2 Score: Not on file  Financial Resource Strain:   . Difficulty of Paying Living Expenses: Not on file  Food Insecurity:   . Worried About Programme researcher, broadcasting/film/video in the Last Year: Not on file  . Ran Out of Food in the Last Year: Not on file  Housing:   . Last Housing Risk Score: Not on file  Transportation Needs:   . Lack of Transportation (Medical): Not on file  . Lack of Transportation (Non-Medical): Not on file     Objective:    No Known Allergies  Medications:    Medications Reviewed Today    Reviewed by Alleen Borne, RN (Registered Nurse) on 04/29/20 at [phone]  Med List Status: <None>  Medication Order Taking? Sig Documenting Provider Last Dose Status Informant  etonogestrel-ethinyl estradiol (NUVARING) 0.12-0.015 MG/24HR vaginal ring [id number]   [provider]  Active           Assessment:   Patient Care Plan: Manage my Health    Problem Identified: Establish New Providers     Goal: Self-Management Plan Developed   Start Date: 06/04/2020  Expected End Date: 08/05/2020  This Visit's Progress: On track  Priority: High  Note:   Current Barriers:  . Care Coordination needs related to establishing PCP and GYN providers. Patient recently divorced with three children has moved back to the area and needs to procure new patient appointments with  providers accepting Medicaid. She has called several offices and has been unable to establish care. . Corporate treasurer.  . Unable to independently procure appointment with PCP and GYN.  Gabrielle Zimmerman social connections  Nurse Case Manager Clinical Goal(s):  [name] Over the next 60 days, patient will verbalize understanding of plan for establishing care . Over the next 30 days, patient will work with Care Guide to address needs related to establishing PCP and GYN appointment  Interventions:  . Inter-disciplinary care team collaboration (see longitudinal plan of care) . Discussed plans with patient for ongoing care management follow up and provided patient with direct contact information for care management team . Care Guide referral for assistance with making appointment and local resources to help with financial constraints . Collaborate with LCSW for stress management  Patient Goals/Self-Care Activities Over the next 60 days, patient will:  -Patient verbalizes understanding of plan to work with Care Guide for establishing PCP, GYN appointments and community resources available - Attends all scheduled provider appointments - follow-up on any referrals for help I am given - make a list of family or friends that I can call  Follow Up Plan: Telephone follow up appointment with Managed Medicaid care management team member scheduled for:08/10/20 @ 1pm        Plan: RNCM will follow up with a telephone visit on 08/10/20 @ 1pm  Estanislado Emms RN, BSN [place]  [facility] Care  Coordinator

## 2020-06-04 NOTE — Patient Instructions (Signed)
Visit Information  Gabrielle Zimmerman was given information about Medicaid Managed Care team care coordination services as a part of their Healthy [facility] Medicaid benefit. [name] verbally consented to engagement with the [facility] Managed Care team.   For questions related to your Healthy [facility] health plan, please call: [phone] or visit the homepage here: [email or web address]  If you would like to schedule transportation through your Healthy [facility] plan, please call the following number at least 2 days in advance of your appointment: [phone]  Goals Addressed            This Visit's Progress    Find Help in My Community         - follow-up on any referrals for help I am given - make a list of family or friends that I can call          Make and Keep All Appointments         - arrange a ride through an agency 1 week before appointment - keep a calendar with appointment dates          Patient Care Plan: Manage my Health    Problem Identified: Establish New Providers     Goal: Self-Management Plan Developed   Start Date: 06/04/2020  Expected End Date: 08/05/2020  This Visit's Progress: On track  Priority: High  Note:   Current Barriers:   Care Coordination needs related to establishing PCP and GYN providers. Patient recently divorced with three children has moved back to the area and needs to procure new patient appointments with providers accepting Medicaid. She has called several offices and has been unable to establish care.  Film/video editor.   Unable to independently procure appointment with PCP and GYN.   Lacks Special educational needs teacher):   Over the next 60 days, patient will verbalize understanding of plan for establishing care  Over the next 30 days, patient will work with Care Guide to address needs related to establishing PCP and GYN appointment  Interventions:    Inter-disciplinary care team collaboration (see longitudinal plan of care)  Discussed plans with patient for ongoing care management follow up and provided patient with direct contact information for care management team  Care Guide referral for assistance with making appointment and local resources to help with financial constraints  Collaborate with LCSW for stress management  Patient Goals/Self-Care Activities Over the next 60 days, patient will:  -Patient verbalizes understanding of plan to work with Care Guide for establishing PCP, GYN appointments and community resources available - Attends all scheduled provider appointments - follow-up on any referrals for help I am given - make a list of family or friends that I can call  Follow Up Plan: Telephone follow up appointment with Managed Medicaid care management team member scheduled for:08/10/20 @ 1pm       Please see education materials related to stress provided by MyChart link.     Managing Stress, Adult Feeling a certain amount of stress is normal. Stress helps our body and mind get ready to deal with the demands of life. Stress hormones can motivate you to do well at work and meet your responsibilities. However severe or long-lasting (chronic) stress can affect your mental and physical health. Chronic stress puts you at higher risk for anxiety, depression, and other health problems like digestive problems, muscle aches, heart disease, high blood pressure, and stroke. What are the causes? Common causes of stress include:  Demands from work,  such as deadlines, feeling overworked, or having long hours.  Pressures at home, such as money issues, disagreements with a spouse, or parenting issues.  Pressures from major life changes, such as divorce, moving, loss of a loved one, or chronic illness. You may be at higher risk for stress-related problems if you do not get enough sleep, are in poor health, do not have emotional  support, or have a mental health disorder like anxiety or depression. How to recognize stress Stress can make you:  Have trouble sleeping.  Feel sad, anxious, irritable, or overwhelmed.  Lose your appetite.  Overeat or want to eat unhealthy foods.  Want to use drugs or alcohol. Stress can also cause physical symptoms, such as:  Sore, tense muscles, especially in the shoulders and neck.  Headaches.  Trouble breathing.  A faster heart rate.  Stomach pain, nausea, or vomiting.  Diarrhea or constipation.  Trouble concentrating. Follow these instructions at home: Lifestyle  Identify the source of your stress and your reaction to it. See a therapist who can help you change your reactions.  When there are stressful events: ? Talk about it with family, friends, or co-workers. ? Try to think realistically about stressful events and not ignore them or overreact. ? Try to find the positives in a stressful situation and not focus on the negatives. ? Cut back on responsibilities at work and home, if possible. Ask for help from friends or family members if you need it.  Find ways to cope with stress, such as: ? Meditation. ? Deep breathing. ? Yoga or tai chi. ? Progressive muscle relaxation. ? Doing art, playing music, or reading. ? Making time for fun activities. ? Spending time with family and friends.  Get support from family, friends, or spiritual resources. Eating and drinking  Eat a healthy diet. This includes: ? Eating foods that are high in fiber, such as beans, whole grains, and fresh fruits and vegetables. ? Limiting foods that are high in fat and processed sugars, such as fried and sweet foods.  Do not skip meals or overeat.  Drink enough fluid to keep your urine pale yellow. Alcohol use  Do not drink alcohol if: ? Your health care provider tells you not to drink. ? You are pregnant, may be pregnant, or are planning to become pregnant.  Drinking alcohol is  a way some people try to ease their stress. This can be dangerous, so if you drink alcohol: ? Limit how much you use to:  0-1 drink a day for women.  0-2 drinks a day for men. ? Be aware of how much alcohol is in your drink. In the [place] one drink equals one 12 oz bottle of beer (355 mL), one 5 oz glass of wine (148 mL), or one 1 oz glass of hard liquor (44 mL). Activity   Include 30 minutes of exercise in your daily schedule. Exercise is a good stress reducer.  Include time in your day for an activity that you find relaxing. Try taking a walk, going on a bike ride, reading a book, or listening to music.  Schedule your time in a way that lowers stress, and keep a consistent schedule. Prioritize what is most important to get done. General instructions  Get enough sleep. Try to go to sleep and get up at about the same time every day.  Take over-the-counter and prescription medicines only as told by your health care provider.  Do not use any products that contain nicotine or  tobacco, such as cigarettes, e-cigarettes, and chewing tobacco. If you need help quitting, ask your health care provider.  Do not use drugs or smoke to cope with stress.  Keep all follow-up visits as told by your health care provider. This is important. Where to find support  Talk with your health care provider about stress management or finding a support group.  Find a therapist to work with you on your stress management techniques. Contact a health care provider if:  Your stress symptoms get worse.  You are unable to manage your stress at home.  You are struggling to stop using drugs or alcohol. Get help right away if:  You may be a danger to yourself or others.  You have any thoughts of death or suicide. If you ever feel like you may hurt yourself or others, or have thoughts about taking your own life, get help right away. You can go to your nearest emergency department or call:  Your local emergency  services (911 in the [place]  A suicide crisis helpline, such as the [place] at 236-729-9089. This is open 24 hours a day. Summary  Feeling a certain amount of stress is normal, but severe or long-lasting (chronic) stress can affect your mental and physical health.  Chronic stress can put you at higher risk for anxiety, depression, and other health problems like digestive problems, muscle aches, heart disease, high blood pressure, and stroke.  You may be at higher risk for stress-related problems if you do not get enough sleep, are in poor health, lack emotional support, or have a mental health disorder like anxiety or depression.  Identify the source of your stress and your reaction to it. Try talking about stressful events with family, friends, or co-workers, finding a coping method, or getting support from spiritual resources.  If you need more help, talk with your health care provider about finding a support group or a mental health therapist. This information is not intended to replace advice given to you by your health care provider. Make sure you discuss any questions you have with your health care provider. Document Revised: 01/15/2019 Document Reviewed: 01/15/2019 Elsevier Patient Education  [place].   Patient verbalizes understanding of instructions provided today.   Telephone follow up appointment with Managed Medicaid care management team member scheduled for:08/10/20 @ [place] RN, [place] RN Care Coordinator

## 2020-06-09 ENCOUNTER — Other Ambulatory Visit: Payer: Self-pay
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
## 2020-06-10 ENCOUNTER — Other Ambulatory Visit: Payer: Self-pay

## 2020-06-10 ENCOUNTER — Ambulatory Visit: Payer: Self-pay
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
## 2020-06-24 ENCOUNTER — Ambulatory Visit (INDEPENDENT_AMBULATORY_CARE_PROVIDER_SITE_OTHER): Payer: Medicaid Other | Admitting: Nurse Practitioner

## 2020-06-24 ENCOUNTER — Encounter: Payer: Self-pay | Admitting: Nurse Practitioner

## 2020-06-24 ENCOUNTER — Other Ambulatory Visit: Payer: Self-pay

## 2020-06-24 VITALS — BP 139/89 | HR 73 | Temp 97.3°F | Resp 18 | Ht 68.0 in | Wt 180.6 lb

## 2020-06-24 DIAGNOSIS — Z7689 Persons encountering health services in other specified circumstances: Secondary | ICD-10-CM

## 2020-06-24 DIAGNOSIS — D649 Anemia, unspecified: Secondary | ICD-10-CM | POA: Insufficient documentation

## 2020-06-24 LAB — POCT URINALYSIS DIP (CLINITEK)
Bilirubin, UA: NEGATIVE
Blood, UA: NEGATIVE
Glucose, UA: NEGATIVE mg/dL
Ketones, POC UA: NEGATIVE mg/dL
Leukocytes, UA: NEGATIVE
Nitrite, UA: NEGATIVE
POC PROTEIN,UA: NEGATIVE
Spec Grav, UA: 1.025 (ref 1.010–1.025)
Urobilinogen, UA: 0.2 E.U./dL
pH, UA: 6 (ref 5.0–8.0)

## 2020-06-24 LAB — POCT GLYCOSYLATED HEMOGLOBIN (HGB A1C): Hemoglobin A1C: 4.9 % (ref 4.0–5.6)

## 2020-06-24 NOTE — Progress Notes (Signed)
[facility] Patient [facility] [address] [place], [place]  [address] Phone:  [phone]   Fax:  [phone]   New Patient Office Visit  Subjective:  Patient ID: Gabrielle Zimmerman, adult    DOB: 03-13-1981  Age: 39 y.o. MRN: [id number]  CC:  Chief Complaint  Patient presents with   Establish Care    HPI Gabrielle Zimmerman presents to establish care. She  has a past medical history of Anemia and Ovarian cyst.   She admits that she is doing well overall. She denies any immediate concerns. She noted that she has gained approximately 30 pounds in the last 2 years but feels like this was related to her new relationship. She is going to make some lifestyle changes and get back into her routine.  She admits that she has a history of ovarian cyst on 2 occasions. She is aware that she is due for her Pap test. She denies any current problems.    Past Medical History:  Diagnosis Date   Anemia    Ovarian cyst     Past Surgical History:  Procedure Laterality Date   LEEP      Family History  Problem Relation Age of Onset   Healthy Mother    Diabetes Father     Social History   Socioeconomic History   Marital status: Divorced    Spouse name: Not on file   Number of children: 3   Years of education: Bachelor's degree   Highest education level: Bachelor's degree (e.g., BA, AB, BS)  Occupational History   Occupation: Nutritionist and Systems analyst  Tobacco Use   Smoking status: Never Smoker   Smokeless tobacco: Never Used  Building services engineer Use: Never used  Substance and Sexual Activity   Alcohol use: Yes    Comment: weekends   Drug use: Never   Sexual activity: Yes    Birth control/protection: None, I.U.D.    Comment: Nuva-ring  Other Topics Concern   Not on file  Social History Narrative   Not on file   Social Determinants of Health   Financial Resource Strain: Medium Risk   Difficulty of Paying Living Expenses: Somewhat hard   Food Insecurity: No Food Insecurity   Worried About Running Out of Food in the Last Year: Never true   Ran Out of Food in the Last Year: Never true  Transportation Needs: No Transportation Needs   Lack of Transportation (Medical): No   Lack of Transportation (Non-Medical): No  Physical Activity: Sufficiently Active   Days of Exercise per Week: 5 days   Minutes of Exercise per Session: 50 min  Stress: No Stress Concern Present   Feeling of Stress : Not at all  Social Connections: Socially Isolated   Frequency of Communication with Friends and Family: More than three times a week   Frequency of Social Gatherings with Friends and Family: More than three times a week   Attends Religious Services: Never   Database administrator or Organizations: No   Attends Engineer, structural: Never   Marital Status: Divorced  Catering manager Violence: Not At Risk   Fear of Current or Ex-Partner: No   Emotionally Abused: No   Physically Abused: No   Sexually Abused: No    ROS Review of Systems  Constitutional: Negative.   HENT: Negative.   Eyes: Negative.   Respiratory: Negative.   Cardiovascular: Negative.   Gastrointestinal: Negative.   Endocrine: Negative.  Genitourinary: Negative.   Musculoskeletal: Positive for joint swelling (hx of left knee, She has completed PT which was effective ).  Skin: Negative.   Allergic/Immunologic: Negative.   Hematological: Negative.   Psychiatric/Behavioral: Negative.     Objective:   Today's Vitals: BP 139/89 (BP Location: Left Arm, Patient Position: Sitting, Cuff Size: Normal)    Pulse 73    Temp (!) 97.3 F (36.3 C) (Temporal)    Resp 18    Ht 5\' 8"  (1.727 m)    Wt 180 lb 9.6 oz (81.9 kg)    SpO2 100%    BMI 27.46 kg/m   Physical Exam Constitutional:      General: She is not in acute distress.    Appearance: She is not ill-appearing, toxic-appearing or diaphoretic.  HENT:     Head: Normocephalic and atraumatic.      Right Ear: Tympanic membrane normal.     Left Ear: Tympanic membrane normal.     Nose: Nose normal.     Mouth/Throat:     Mouth: Mucous membranes are moist.  Cardiovascular:     Rate and Rhythm: Normal rate and regular rhythm.     Pulses: Normal pulses.     Heart sounds: Normal heart sounds.  Pulmonary:     Effort: Pulmonary effort is normal.     Breath sounds: Normal breath sounds.  Abdominal:     General: Abdomen is flat. Bowel sounds are normal.     Palpations: Abdomen is soft.  Musculoskeletal:        General: Normal range of motion.     Cervical back: Normal range of motion.  Skin:    General: Skin is warm and dry.     Capillary Refill: Capillary refill takes less than 2 seconds.  Neurological:     General: No focal deficit present.     Mental Status: She is alert and oriented to person, place, and time.  Psychiatric:        Mood and Affect: Mood normal.        Behavior: Behavior normal.        Thought Content: Thought content normal.        Judgment: Judgment normal.     Assessment & Plan:   Problem List Items Addressed This Visit   None   Visit Diagnoses    Encounter to establish care    -  Primary Discussed female health maintenance she will return in 4 to 6 weeks for well woman physical Discussed diet and exercise    Relevant Orders   HgB A1c (Completed)   POCT URINALYSIS DIP (CLINITEK) (Completed)      Outpatient Encounter Medications as of 06/24/2020  Medication Sig   [DISCONTINUED] etonogestrel-ethinyl estradiol (NUVARING) 0.12-0.015 MG/24HR vaginal ring    [DISCONTINUED] morphine (MSIR) 15 MG tablet Take 0.5 tablets (7.5 mg total) by mouth every 4 (four) hours as needed for severe pain.   [DISCONTINUED] ondansetron (ZOFRAN ODT) 4 MG disintegrating tablet 4mg  ODT q4 hours prn nausea/vomit   No facility-administered encounter medications on file as of 06/24/2020.    Follow-up: Return for well woman physcial.   , NP

## 2020-06-24 NOTE — Patient Instructions (Signed)
Health Maintenance, Female Adopting a healthy lifestyle and getting preventive care are important in promoting health and wellness. Ask your health care provider about:  The right schedule for you to have regular tests and exams.  Things you can do on your own to prevent diseases and keep yourself healthy. What should I know about diet, weight, and exercise? Eat a healthy diet   Eat a diet that includes plenty of vegetables, fruits, low-fat dairy products, and lean protein.  Do not eat a lot of foods that are high in solid fats, added sugars, or sodium. Maintain a healthy weight Body mass index (BMI) is used to identify weight problems. It estimates body fat based on height and weight. Your health care provider can help determine your BMI and help you achieve or maintain a healthy weight. Get regular exercise Get regular exercise. This is one of the most important things you can do for your health. Most adults should:  Exercise for at least 150 minutes each week. The exercise should increase your heart rate and make you sweat (moderate-intensity exercise).  Do strengthening exercises at least twice a week. This is in addition to the moderate-intensity exercise.  Spend less time sitting. Even light physical activity can be beneficial. Watch cholesterol and blood lipids Have your blood tested for lipids and cholesterol at 37 years of age, then have this test every 5 years. Have your cholesterol levels checked more often if:  Your lipid or cholesterol levels are high.  You are older than 37 years of age.  You are at high risk for heart disease. What should I know about cancer screening? Depending on your health history and family history, you may need to have cancer screening at various ages. This may include screening for:  Breast cancer.  Cervical cancer.  Colorectal cancer.  Skin cancer.  Lung cancer. What should I know about heart disease, diabetes, and high blood  pressure? Blood pressure and heart disease  High blood pressure causes heart disease and increases the risk of stroke. This is more likely to develop in people who have high blood pressure readings, are of African descent, or are overweight.  Have your blood pressure checked: ? Every 3-5 years if you are 18-39 years of age. ? Every year if you are 40 years old or older. Diabetes Have regular diabetes screenings. This checks your fasting blood sugar level. Have the screening done:  Once every three years after age 40 if you are at a normal weight and have a low risk for diabetes.  More often and at a younger age if you are overweight or have a high risk for diabetes. What should I know about preventing infection? Hepatitis B If you have a higher risk for hepatitis B, you should be screened for this virus. Talk with your health care provider to find out if you are at risk for hepatitis B infection. Hepatitis C Testing is recommended for:  Everyone born from 1945 through 1965.  Anyone with known risk factors for hepatitis C. Sexually transmitted infections (STIs)  Get screened for STIs, including gonorrhea and chlamydia, if: ? You are sexually active and are younger than 37 years of age. ? You are older than 37 years of age and your health care provider tells you that you are at risk for this type of infection. ? Your sexual activity has changed since you were last screened, and you are at increased risk for chlamydia or gonorrhea. Ask your health care provider if   you are at risk.  Ask your health care provider about whether you are at high risk for HIV. Your health care provider may recommend a prescription medicine to help prevent HIV infection. If you choose to take medicine to prevent HIV, you should first get tested for HIV. You should then be tested every 3 months for as long as you are taking the medicine. Pregnancy  If you are about to stop having your period (premenopausal) and  you may become pregnant, seek counseling before you get pregnant.  Take 400 to 800 micrograms (mcg) of folic acid every day if you become pregnant.  Ask for birth control (contraception) if you want to prevent pregnancy. Osteoporosis and menopause Osteoporosis is a disease in which the bones lose minerals and strength with aging. This can result in bone fractures. If you are 65 years old or older, or if you are at risk for osteoporosis and fractures, ask your health care provider if you should:  Be screened for bone loss.  Take a calcium or vitamin D supplement to lower your risk of fractures.  Be given hormone replacement therapy (HRT) to treat symptoms of menopause. Follow these instructions at home: Lifestyle  Do not use any products that contain nicotine or tobacco, such as cigarettes, e-cigarettes, and chewing tobacco. If you need help quitting, ask your health care provider.  Do not use street drugs.  Do not share needles.  Ask your health care provider for help if you need support or information about quitting drugs. Alcohol use  Do not drink alcohol if: ? Your health care provider tells you not to drink. ? You are pregnant, may be pregnant, or are planning to become pregnant.  If you drink alcohol: ? Limit how much you use to 0-1 drink a day. ? Limit intake if you are breastfeeding.  Be aware of how much alcohol is in your drink. In the [place] one drink equals one 12 oz bottle of beer (355 mL), one 5 oz glass of wine (148 mL), or one 1 oz glass of hard liquor (44 mL). General instructions  Schedule regular health, dental, and eye exams.  Stay current with your vaccines.  Tell your health care provider if: ? You often feel depressed. ? You have ever been abused or do not feel safe at home. Summary  Adopting a healthy lifestyle and getting preventive care are important in promoting health and wellness.  Follow your health care provider's instructions about healthy  diet, exercising, and getting tested or screened for diseases.  Follow your health care provider's instructions on monitoring your cholesterol and blood pressure. This information is not intended to replace advice given to you by your health care provider. Make sure you discuss any questions you have with your health care provider. Document Revised: 06/12/2018 Document Reviewed: 06/12/2018 Elsevier Patient Education  2020 Elsevier Inc.  

## 2020-07-12 ENCOUNTER — Other Ambulatory Visit: Payer: Self-pay

## 2020-07-12 ENCOUNTER — Ambulatory Visit: Payer: Self-pay
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
## 2020-07-13 ENCOUNTER — Other Ambulatory Visit: Payer: Self-pay

## 2020-07-13 ENCOUNTER — Other Ambulatory Visit (HOSPITAL_COMMUNITY)
Admission: RE | Admit: 2020-07-13 | Discharge: 2020-07-13 | Disposition: A | Discharge status: Home / Self Care | Payer: Medicaid Other | Source: Ambulatory Visit | Attending: Advanced Practice Midwife | Admitting: Advanced Practice Midwife

## 2020-07-13 ENCOUNTER — Ambulatory Visit (INDEPENDENT_AMBULATORY_CARE_PROVIDER_SITE_OTHER): Payer: Medicaid Other | Admitting: Advanced Practice Midwife

## 2020-07-13 ENCOUNTER — Encounter: Payer: Self-pay | Admitting: Advanced Practice Midwife

## 2020-07-13 VITALS — BP 121/85 | HR 69 | Ht 68.0 in | Wt 176.0 lb

## 2020-07-13 DIAGNOSIS — N83209 Unspecified ovarian cyst, unspecified side: Secondary | ICD-10-CM | POA: Insufficient documentation

## 2020-07-13 DIAGNOSIS — Z3044 Encounter for surveillance of vaginal ring hormonal contraceptive device: Secondary | ICD-10-CM | POA: Diagnosis not present

## 2020-07-13 DIAGNOSIS — Z9889 Other specified postprocedural states: Secondary | ICD-10-CM | POA: Diagnosis not present

## 2020-07-13 DIAGNOSIS — Z3040 Encounter for surveillance of contraceptives, unspecified: Secondary | ICD-10-CM | POA: Insufficient documentation

## 2020-07-13 DIAGNOSIS — Z01419 Encounter for gynecological examination (general) (routine) without abnormal findings: Secondary | ICD-10-CM | POA: Insufficient documentation

## 2020-07-13 NOTE — Patient Instructions (Signed)
Ovarian Cyst  An ovarian cyst is a fluid-filled sac that forms on an ovary. The ovaries are small organs that produce eggs in women. Various types of cysts can form on the ovaries. Some may cause symptoms and require treatment. Most ovarian cysts go away on their own, are not cancerous (are benign), and do not cause problems. What are the causes? Ovarian cysts may be caused by:  Ovarian hyperstimulation syndrome. This is a condition that can develop from taking fertility medicines. It causes multiple large ovarian cysts to form.  Polycystic ovarian syndrome (PCOS). This is a common hormonal disorder that can cause ovarian cysts to form, and can cause problems with your period or fertility.  The normal menstrual cycle. What increases the risk? The following factors may make you more likely to develop this condition:  Being overweight or obese.  Taking fertility medicines.  Taking certain forms of hormonal birth control.  Smoking. What are the signs or symptoms? Many ovarian cysts do not cause symptoms. If symptoms are present, they may include:  Pelvic pain or pressure.  Pain in the lower abdomen.  Pain during sex.  Abdominal swelling.  Abnormal menstrual periods.  Increasing pain with menstrual periods. How is this diagnosed? These cysts are commonly found during a routine pelvic exam. You may have tests to find out more about the cyst, such as:  Ultrasound.  CT scan.  MRI.  Blood tests. How is this treated? Many ovarian cysts go away on their own without treatment. Your health care provider may want to check your cyst regularly for 2-3 months to see if it changes. If you are in menopause, it is especially important to have your cyst monitored closely because menopausal women have a higher rate of ovarian cancer. When treatment is needed, it may include:  Medicines to help relieve pain.  A procedure to drain the cyst (aspiration).  Surgery to remove the whole cyst  (cystectomy).  Hormone treatment or birth control pills. These methods are sometimes used to help keep cysts from coming back.  Surgery to remove the ovary (oophorectomy). Follow these instructions at home:  Take over-the-counter and prescription medicines only as told by your health care provider.  Ask your health care provider if any medicine prescribed to you requires you to avoid driving or using machinery.  Get regular pelvic exams and Pap tests as often as told by your health care provider.  Return to your normal activities as told by your health care provider. Ask your health care provider what activities are safe for you.  Do not use any products that contain nicotine or tobacco, such as cigarettes, e-cigarettes, and chewing tobacco. If you need help quitting, ask your health care provider.  Keep all follow-up visits. This is important. Contact a health care provider if:  Your periods are late, irregular, painful, or they stop.  You have pelvic pain that does not go away.  You have pressure on your bladder or trouble emptying your bladder completely.  You have any of the following: ? A feeling of fullness. ? You are gaining weight or losing weight without changing your exercise and eating habits. ? Pain, swelling, or bloating in the abdomen. ? Loss of appetite. ? Pain and pressure in your back and pelvis.  You think you may be pregnant. Get help right away if:  You have abdominal or pelvic pain that is severe or gets worse.  You cannot eat or drink without vomiting.  You suddenly develop a fever   or chills.  Your menstrual period is much heavier than usual. Summary  An ovarian cyst is a fluid-filled sac that forms on an ovary.  Some ovarian cysts may cause symptoms and require treatment.  These cysts are commonly found during a routine pelvic exam.  Many ovarian cysts go away on their own without treatment. This information is not intended to replace advice  given to you by your health care provider. Make sure you discuss any questions you have with your health care provider. Document Revised: 11/27/2019 Document Reviewed: 11/27/2019 Elsevier Patient Education  2021 Elsevier Inc. Health Maintenance, Female Adopting a healthy lifestyle and getting preventive care are important in promoting health and wellness. Ask your health care provider about:  The right schedule for you to have regular tests and exams.  Things you can do on your own to prevent diseases and keep yourself healthy. What should I know about diet, weight, and exercise? Eat a healthy diet  Eat a diet that includes plenty of vegetables, fruits, low-fat dairy products, and lean protein.  Do not eat a lot of foods that are high in solid fats, added sugars, or sodium.   Maintain a healthy weight Body mass index (BMI) is used to identify weight problems. It estimates body fat based on height and weight. Your health care provider can help determine your BMI and help you achieve or maintain a healthy weight. Get regular exercise Get regular exercise. This is one of the most important things you can do for your health. Most adults should:  Exercise for at least 150 minutes each week. The exercise should increase your heart rate and make you sweat (moderate-intensity exercise).  Do strengthening exercises at least twice a week. This is in addition to the moderate-intensity exercise.  Spend less time sitting. Even light physical activity can be beneficial. Watch cholesterol and blood lipids Have your blood tested for lipids and cholesterol at 38 years of age, then have this test every 5 years. Have your cholesterol levels checked more often if:  Your lipid or cholesterol levels are high.  You are older than 38 years of age.  You are at high risk for heart disease. What should I know about cancer screening? Depending on your health history and family history, you may need to have  cancer screening at various 38 ages. This may include screening for:  Breast cancer.  Cervical cancer.  Colorectal cancer.  Skin cancer.  Lung cancer. What should I know about heart disease, diabetes, and high blood pressure? Blood pressure and heart disease  High blood pressure causes heart disease and increases the risk of stroke. This is more likely to develop in people who have high blood pressure readings, are of African descent, or are overweight.  Have your blood pressure checked: ? Every 3-5 years if you are 49-41 years of age. ? Every year if you are 64 years old or older. Diabetes Have regular diabetes screenings. This checks your fasting blood sugar level. Have the screening done:  Once every three years after age 79 if you are at a normal weight and have a low risk for diabetes.  More often and at a younger age if you are overweight or have a high risk for diabetes. What should I know about preventing infection? Hepatitis B If you have a higher risk for hepatitis B, you should be screened for this virus. Talk with your health care provider to find out if you are at risk for hepatitis B infection. Hepatitis  C Testing is recommended for:  Everyone born from [birth year] through 1963.  Anyone with known risk factors for hepatitis C. Sexually transmitted infections (STIs)  Get screened for STIs, including gonorrhea and chlamydia, if: ? You are sexually active and are younger than 38 years of age. ? You are older than 38 years of age and your health care provider tells you that you are at risk for this type of infection. ? Your sexual activity has changed since you were last screened, and you are at increased risk for chlamydia or gonorrhea. Ask your health care provider if you are at risk.  Ask your health care provider about whether you are at high risk for HIV. Your health care provider may recommend a prescription medicine to help prevent HIV infection. If you choose to take  medicine to prevent HIV, you should first get tested for HIV. You should then be tested every 3 months for as long as you are taking the medicine. Pregnancy  If you are about to stop having your period (premenopausal) and you may become pregnant, seek counseling before you get pregnant.  Take 400 to 800 micrograms (mcg) of folic acid every day if you become pregnant.  Ask for birth control (contraception) if you want to prevent pregnancy. Osteoporosis and menopause Osteoporosis is a disease in which the bones lose minerals and strength with aging. This can result in bone fractures. If you are 78 years old or older, or if you are at risk for osteoporosis and fractures, ask your health care provider if you should:  Be screened for bone loss.  Take a calcium or vitamin D supplement to lower your risk of fractures.  Be given hormone replacement therapy (HRT) to treat symptoms of menopause. Follow these instructions at home: Lifestyle  Do not use any products that contain nicotine or tobacco, such as cigarettes, e-cigarettes, and chewing tobacco. If you need help quitting, ask your health care provider.  Do not use street drugs.  Do not share needles.  Ask your health care provider for help if you need support or information about quitting drugs. Alcohol use  Do not drink alcohol if: ? Your health care provider tells you not to drink. ? You are pregnant, may be pregnant, or are planning to become pregnant.  If you drink alcohol: ? Limit how much you use to 0-1 drink a day. ? Limit intake if you are breastfeeding.  Be aware of how much alcohol is in your drink. In the [place] one drink equals one 12 oz bottle of beer (355 mL), one 5 oz glass of wine (148 mL), or one 1 oz glass of hard liquor (44 mL). General instructions  Schedule regular health, dental, and eye exams.  Stay current with your vaccines.  Tell your health care provider if: ? You often feel depressed. ? You have  ever been abused or do not feel safe at home. Summary  Adopting a healthy lifestyle and getting preventive care are important in promoting health and wellness.  Follow your health care provider's instructions about healthy diet, exercising, and getting tested or screened for diseases.  Follow your health care provider's instructions on monitoring your cholesterol and blood pressure. This information is not intended to replace advice given to you by your health care provider. Make sure you discuss any questions you have with your health care provider. Document Revised: 06/12/2018 Document Reviewed: 06/12/2018 Elsevier Patient Education  2021 [employer].

## 2020-07-13 NOTE — Progress Notes (Signed)
GYNECOLOGY ANNUAL PREVENTATIVE CARE ENCOUNTER NOTE  Subjective:   Gabrielle Zimmerman is a 40 y.o. [phone] adult here for a routine annual gynecologic exam.  Current complaints: none.  Is using Nuvaring for contraception, happy with method.  Has appt scheduled with primary doctor for "full STD testing".  Does not want to add it to today's Pap test..   Denies abnormal vaginal bleeding, discharge, pelvic pain, problems with intercourse or other gynecologic concerns.  History large ovarian cyst recently, 9cm, ruptured.  Had pain for 6 wks afterward.   Hx 3 deliveries, uncomplicated and 3 EABs.   No complications    Gynecologic History Patient's last menstrual period was 06/25/2020. Contraception: NuvaRing vaginal inserts Last Pap: Unsure. Results were: normal Last mammogram: never  Obstetric History OB History  Gravida Para Term Preterm AB Living  6 3 3   3     SAB IAB Ectopic Multiple Live Births    3          # Outcome Date GA Lbr Len/2nd Weight Sex Delivery Anes PTL Lv  6 IAB 09/16/19             Birth Comments: medical abortion  5 Term 12/01/08 [redacted]w[redacted]d    Vag-Vacuum        Birth Comments: fetal bradycardia  4 Term      Vag-Spont     3 Term      Vag-Spont     2 IAB           1 IAB             Past Medical History:  Diagnosis Date  . Anemia   . Ovarian cyst     Past Surgical History:  Procedure Laterality Date  . LEEP      No current outpatient medications on file prior to visit.   No current facility-administered medications on file prior to visit.    Allergies  Allergen Reactions  . Pork (Porcine) Protein Nausea And Vomiting  . Pork-Derived Products Nausea And Vomiting, Diarrhea and Other (See Comments)    Stomach cramps/gas     Social History   Socioeconomic History  . Marital status: Divorced    Spouse name: Not on file  . Number of children: 3  . Years of education: Bachelor's degree  . Highest education level: Bachelor's degree (e.g., BA, AB, BS)   Occupational History  . 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 Smoking status: Never Smoker  . Smokeless tobacco: Never Used  Vaping Use  . Vaping Use: Never used  Substance and Sexual Activity  . Alcohol use: Yes    Comment: weekends  . Drug use: Never  . Sexual activity: Yes    Birth control/protection: None    Comment: Nuva-ring  Other Topics Concern  . Not on file  Social History Narrative  . Not on file   Social Determinants of Health   Financial Resource Strain: Medium Risk  . Difficulty of Paying Living Expenses: Somewhat hard  Food Insecurity: No Food Insecurity  . Worried About Systems analyst in the Last Year: Never true  . Ran Out of Food in the Last Year: Never true  Transportation Needs: No Transportation Needs  . Lack of Transportation (Medical): No  . Lack of Transportation (Non-Medical): No  Physical Activity: Sufficiently Active  . Days of Exercise per Week: 5 days  . Minutes of Exercise per Session: 50 min  Stress: No Stress Concern Present  . Feeling  of Stress : Not at all  Social Connections: Socially Isolated  . Frequency of Communication with Friends and Family: More than three times a week  . Frequency of Social Gatherings with Friends and Family: More than three times a week  . Attends Religious Services: Never  . Active Member of Clubs or Organizations: No  . Attends Banker Meetings: Never  . Marital Status: Divorced  Catering manager Violence: Not At Risk  . Fear of Current or Ex-Partner: No  . Emotionally Abused: No  . Physically Abused: No  . Sexually Abused: No    Family History  Problem Relation Age of Onset  . Healthy Mother   . Diabetes Father     The following portions of the patient's history were reviewed and updated as appropriate: allergies, current medications, past family history, past medical history, past social history, past surgical history and problem list.  Review of  Systems Constitutional: negative Respiratory: negative Cardiovascular: negative Gastrointestinal: negative Genitourinary:negative Integument/breast: negative   Objective:  BP 121/85   Pulse 69   Ht 5\' 8"  (1.727 m)   Wt 176 lb (79.8 kg)   LMP 06/25/2020   BMI 26.76 kg/m  CONSTITUTIONAL: Well-developed, well-nourished female in no acute distress.  HENT:  Normocephalic, atraumatic, External right and left ear normal. Oropharynx is clear and moist EYES: Conjunctivae and EOM are normal. No scleral icterus.  NECK: Normal range of motion, supple, no masses.   SKIN: Skin is warm and dry. No rash noted. Not diaphoretic. No erythema. No pallor. NEUROLOGIC: Alert and oriented to person, place, and time. Normal reflexes, muscle tone coordination. No cranial nerve deficit noted. PSYCHIATRIC: Normal mood and affect. Normal behavior. Normal judgment and thought content. CARDIOVASCULAR: Normal heart rate noted, regular rhythm RESPIRATORY: Clear to auscultation bilaterally. Effort and breath sounds normal, no problems with respiration noted. BREASTS: Symmetric in size. No masses, skin changes, nipple drainage, or lymphadenopathy. ABDOMEN: Soft, normal bowel sounds, no distention noted.  No tenderness, rebound or guarding.  PELVIC: Normal appearing external genitalia; normal appearing vaginal mucosa and cervix.  No abnormal discharge noted.  Pap smear obtained.  Normal uterine size, no other palpable masses, no uterine or adnexal tenderness. MUSCULOSKELETAL: Normal range of motion. No tenderness.  No cyanosis, clubbing, or edema.  2+ distal pulses.   Assessment:  Annual gynecologic examination with pap smear States will get "full STD testing" at primary doctor appt soon   Plan:  Will follow up results of pap smear and manage accordingly. Mammogram :  Discussed would start at age 62 Routine preventative health maintenance measures emphasized. Briefly discussed recommendation for Flu and Covid  vaccines, declines for now. Please refer to After Visit Summary for other counseling recommendations.

## 2020-07-15 LAB — CYTOLOGY - PAP
Comment: NEGATIVE
Diagnosis: NEGATIVE
High risk HPV: NEGATIVE

## 2020-07-19 ENCOUNTER — Ambulatory Visit: Payer: Self-pay

## 2020-07-21 ENCOUNTER — Emergency Department (HOSPITAL_BASED_OUTPATIENT_CLINIC_OR_DEPARTMENT_OTHER)
Admission: EM | Admit: 2020-07-21 | Discharge: 2020-07-21 | Disposition: A | Discharge status: Home / Self Care | Payer: Medicaid Other | Attending: Emergency Medicine | Admitting: Emergency Medicine

## 2020-07-21 ENCOUNTER — Other Ambulatory Visit: Payer: Self-pay

## 2020-07-21 ENCOUNTER — Encounter (HOSPITAL_BASED_OUTPATIENT_CLINIC_OR_DEPARTMENT_OTHER): Payer: Self-pay

## 2020-07-21 DIAGNOSIS — J069 Acute upper respiratory infection, unspecified: Secondary | ICD-10-CM

## 2020-07-21 DIAGNOSIS — R509 Fever, unspecified: Secondary | ICD-10-CM | POA: Diagnosis not present

## 2020-07-21 DIAGNOSIS — U071 COVID-19: Secondary | ICD-10-CM | POA: Insufficient documentation

## 2020-07-21 DIAGNOSIS — M791 Myalgia, unspecified site: Secondary | ICD-10-CM | POA: Diagnosis present

## 2020-07-21 DIAGNOSIS — Z20822 Contact with and (suspected) exposure to covid-19: Secondary | ICD-10-CM | POA: Diagnosis not present

## 2020-07-21 MED ORDER — ACETAMINOPHEN 325 MG PO TABS
650.0000 mg | ORAL_TABLET | Freq: Once | ORAL | Status: AC
  Administered 2020-07-21: 650 mg via ORAL
  Filled 2020-07-21: qty 2

## 2020-07-21 NOTE — ED Triage Notes (Signed)
Pt c/o body aches, HA x this am with +covid exposure-NAD-steady gait

## 2020-07-21 NOTE — ED Provider Notes (Signed)
[facility] EMERGENCY DEPARTMENT Provider Note   CSN: [id number] Arrival date & time: 07/21/20  1254     History Chief Complaint  Patient presents with  . Generalized Body Aches  . Covid Exposure    Gabrielle Zimmerman is a 40 y.o. adult.  The history is provided by the patient. No language interpreter was used.  Fever Max temp prior to arrival:  100 Temp source:  Oral Severity:  Moderate Onset quality:  Gradual Duration:  1 day Timing:  Constant Progression:  Worsening Chronicity:  New Relieved by:  Nothing Worsened by:  Nothing Associated symptoms: no nausea   Risk factors: sick contacts    Pt's son tested positive yesterday.  Pt complains of body aches and fever    Past Medical History:  Diagnosis Date  . Anemia   . Ovarian cyst     Patient Active Problem List   Diagnosis Date Noted  . Ovarian cyst rupture 07/13/2020  . Contraceptive surveillance 07/13/2020  . H/O LEEP 07/13/2020  . Anemia 06/24/2020    Past Surgical History:  Procedure Laterality Date  . LEEP       OB History    Gravida  6   Para  3   Term  3   Preterm      AB  3   Living        SAB      IAB  3   Ectopic      Multiple      Live Births              Family History  Problem Relation Age of Onset  . Healthy Mother   . Diabetes Father     Social History   Tobacco Use  . Smoking status: Never Smoker  . Smokeless tobacco: Never Used  Vaping Use  . Vaping Use: Never used  Substance Use Topics  . Alcohol use: Yes    Comment: weekends  . Drug use: Never    Home Medications Prior to Admission medications   Not on File    Allergies    Pork (porcine) protein and Pork-derived products  Review of Systems   Review of Systems  Constitutional: Positive for fever.  Gastrointestinal: Negative for nausea.  All other systems reviewed and are negative.   Physical Exam Updated Vital Signs BP 132/78 (BP Location: Right Arm)   Pulse 86   Temp  100.1 F (37.8 C) (Oral)   Resp 18   LMP 06/25/2020   SpO2 100%   Physical Exam Vitals and nursing note reviewed.  Constitutional:      Appearance: She is well-developed and well-nourished.  HENT:     Head: Normocephalic.  Eyes:     Extraocular Movements: EOM normal.  Cardiovascular:     Rate and Rhythm: Normal rate.  Pulmonary:     Effort: Pulmonary effort is normal.  Abdominal:     General: There is no distension.  Musculoskeletal:        General: Normal range of motion.     Cervical back: Normal range of motion.  Neurological:     Mental Status: She is alert and oriented to person, place, and time.  Psychiatric:        Mood and Affect: Mood and affect normal.     ED Results / Procedures / Treatments   Labs (all labs ordered are listed, but only abnormal results are displayed) Labs Reviewed  SARS CORONAVIRUS 2 (TAT 6-24 HRS)  EKG None  Radiology No results found.  Procedures Procedures (including critical care time)  Medications Ordered in ED Medications  acetaminophen (TYLENOL) tablet 650 mg (650 mg Oral Given 07/21/20 1542)    ED Course  I have reviewed the triage vital signs and the nursing notes.  Pertinent labs & imaging results that were available during my care of the patient were reviewed by me and considered in my medical decision making (see chart for details).    MDM Rules/Calculators/A&P                          Covid ordered.  Pt advised tylenol.   Final Clinical Impression(s) / ED Diagnoses Final diagnoses:  Upper respiratory tract infection, unspecified type    Rx / DC Orders ED Discharge Orders    None    An After Visit Summary was printed and given to the patient.    Elson Areas, Cordelia Poche 07/21/20 1615    Benjiman Core, MD 07/22/20 [phone]

## 2020-07-21 NOTE — Discharge Instructions (Addendum)
Your covid test is pending 

## 2020-07-22 LAB — SARS CORONAVIRUS 2 (TAT 6-24 HRS): SARS Coronavirus 2: POSITIVE — AB

## 2020-07-23 ENCOUNTER — Telehealth: Payer: Self-pay

## 2020-07-23 NOTE — Telephone Encounter (Signed)
Called to discuss with patient about COVID-19 symptoms and the use of one of the available treatments for those with mild to moderate Covid symptoms and at a high risk of hospitalization.  Pt appears to qualify for outpatient treatment due to co-morbid conditions and/or a member of an at-risk group in accordance with the FDA Emergency Use Authorization.    Symptom onset: 07/18/20 Vaccinated: NO Booster? No Immunocompromised? No Qualifiers: None  Unable to reach pt - Reached pt. Pt. Is not interested in further treatment. Esther Hardy

## 2020-07-26 ENCOUNTER — Other Ambulatory Visit: Payer: Self-pay

## 2020-07-26 NOTE — Patient Outreach (Signed)
  Medicaid Managed Care Social Work Note  07/26/2020 Name:  Gabrielle Zimmerman MRN:  [id number] DOB:  September 13, 1980  Gabrielle Zimmerman is an 40 y.o. year old adult who is a primary patient of Barbette Merino, NP.  The Medicaid Managed Care Coordination team was consulted for assistance with:  Mental Health Counseling and Resources  Gabrielle Zimmerman was given information about Medicaid Managed CareCoordination services today. Gabrielle Zimmerman agreed to services and verbal consent obtained.  Engaged with patient  for by telephone forfollow up visit in response to referral for case management and/or care coordination services.   Assessments/Interventions:  Review of past medical history, allergies, medications, health status, including review of consultants reports, laboratory and other test data, was performed as part of comprehensive evaluation and provision of chronic care management services.  SDOH: (Social Determinant of Health) assessments and interventions performed:   Advanced Directives Status:  Not addressed in this encounter.  Care Plan                 Allergies  Allergen Reactions  . Pork (Porcine) Protein Nausea And Vomiting  . Pork-Derived Products Nausea And Vomiting, Diarrhea and Other (See Comments)    Stomach cramps/gas     Medications Reviewed Today    Reviewed by Cindee Salt, CPhT (Pharmacy Technician) on 07/21/20 at 1444  Med List Status: Complete  Medication Order Taking? Sig Documenting Provider Last Dose Status Informant           No Medications to Display                          Patient Active Problem List   Diagnosis Date Noted  . Ovarian cyst rupture 07/13/2020  . Contraceptive surveillance 07/13/2020  . H/O LEEP 07/13/2020  . Anemia 06/24/2020    Conditions to be addressed/monitored per PCP order:  None  There are no care plans that you recently modified to display for this patient.   Follow up:  Patient agrees to Care Plan and Follow-up.  Plan:  The Managed Medicaid care management team will reach out to the patient again over the next 30 days.  Date/time of next scheduled Social Work care management/care coordination outreach:  08/26/2020  Gus Puma, BSW, [facility]  [place]  High Risk Managed Medicaid Team

## 2020-07-28 ENCOUNTER — Encounter: Payer: Medicaid Other | Admitting: Nurse Practitioner

## 2020-07-29 NOTE — Patient Outreach (Signed)
Care Coordination  07/26/2020  Gabrielle Zimmerman [birth year] [id number]  Gabrielle Zimmerman is a 40 y.o. year old adult who sees Barbette Merino, NP for primary care. The  [facility] Managed Care team was consulted for assistance with Patient does not have a PCP.. [name] was given information about Care Management services, agreed to services, and verbal consent for services was obtained.  Interventions:  . Patient interviewed and appropriate assessments performed . Collaborated with clinical team regarding patient needs  . SDOH (Social Determinants of Health) assessments performed: Yes   Patient cancelled appointment BSW scheduled. Patient stated she would like a PCP that is closer to her. BSW contacted [place] at the [facility] [phone], they only have a female PA that does not have appointment until May 2022. Patient stated she would like a sooner appointment. .   Plan:  . Over the next 30 days, patient will work with BSW to address needs related to Provider Appointment needs. . Social Worker will follow up with PCP.   Gus Puma, BSW, [place] [facility]  [place]  High Risk Managed Medicaid Team
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
## 2020-08-09 ENCOUNTER — Other Ambulatory Visit: Payer: Self-pay

## 2020-08-09 NOTE — Patient Instructions (Signed)
Visit Information  Ms. ARLINE KETTER  - as a part of your Medicaid benefit, you are eligible for care management and care coordination services at no cost or copay. I was unable to reach you by phone today but would be happy to help you with your health related needs. Please feel free to call me @ [phone].     Gus Puma, BSW, [facility]  [place]  High Risk Managed Medicaid Team

## 2020-08-09 NOTE — Patient Outreach (Signed)
Care Coordination  08/09/2020  [name] 12/12/1980 [id number]   Medicaid Managed Care   Unsuccessful Outreach Note  08/09/2020 Name: Gabrielle Zimmerman MRN: [id number] DOB: 11/23/1980  Referred by: Barbette Merino, NP Reason for referral : High Risk Managed Medicaid (HR MM Unsuccessful Telephone Outreach)   An unsuccessful telephone outreach was attempted today. The patient was referred to the case management team for assistance with care management and care coordination.   Follow Up Plan: The patient has been provided with contact information for the care management team and has been advised to call with any health related questions or concerns.   Gus Puma, BSW, [facility]  [place]  High Risk Managed Medicaid Team

## 2020-08-10 ENCOUNTER — Other Ambulatory Visit: Payer: Self-pay | Admitting: *Deleted

## 2020-08-10 NOTE — Patient Instructions (Signed)
Visit Information  Ms. Gabrielle Zimmerman  - as a part of your Medicaid benefit, you are eligible for care management and care coordination services at no cost or copay. I was unable to reach you by phone today but would be happy to help you with your health related needs. Please feel free to call me @ [phone].   A member of the Managed Medicaid care management team will reach out to you again over the next 7-14 days.   Estanislado Emms RN, BSN [place]  [facility]

## 2020-08-10 NOTE — Patient Outreach (Signed)
Care Coordination  08/10/2020  Gabrielle Zimmerman [birth year] [id number]    Medicaid Managed Care   Unsuccessful Outreach Note  08/10/2020 Name: Gabrielle Zimmerman MRN: [id number] DOB: 1981/04/19  Referred by: Barbette Merino, NP Reason for referral : High Risk Managed Medicaid (Unsuccessful RNCM follow up outreach)   An unsuccessful telephone outreach was attempted today. The patient was referred to the case management team for assistance with care management and care coordination.   Follow Up Plan: The care management team will reach out to the patient again over the next 7-14 days.   Estanislado Emms RN, BSN [place]  [facility]

## 2020-08-20 ENCOUNTER — Encounter: Payer: Self-pay | Admitting: Nurse Practitioner

## 2020-08-20 ENCOUNTER — Ambulatory Visit (INDEPENDENT_AMBULATORY_CARE_PROVIDER_SITE_OTHER): Payer: Medicaid Other | Admitting: Nurse Practitioner

## 2020-08-20 ENCOUNTER — Other Ambulatory Visit: Payer: Self-pay

## 2020-08-20 VITALS — BP 112/75 | HR 71 | Ht 68.0 in | Wt 169.0 lb

## 2020-08-20 DIAGNOSIS — Z13220 Encounter for screening for lipoid disorders: Secondary | ICD-10-CM

## 2020-08-20 DIAGNOSIS — Z113 Encounter for screening for infections with a predominantly sexual mode of transmission: Secondary | ICD-10-CM

## 2020-08-20 DIAGNOSIS — Z131 Encounter for screening for diabetes mellitus: Secondary | ICD-10-CM

## 2020-08-20 NOTE — Progress Notes (Signed)
Patient has completed Well woman exam with GYN office.

## 2020-08-20 NOTE — Patient Instructions (Signed)
Health Maintenance, Female Adopting a healthy lifestyle and getting preventive care are important in promoting health and wellness. Ask your health care provider about:  The right schedule for you to have regular tests and exams.  Things you can do on your own to prevent diseases and keep yourself healthy. What should I know about diet, weight, and exercise? Eat a healthy diet  Eat a diet that includes plenty of vegetables, fruits, low-fat dairy products, and lean protein.  Do not eat a lot of foods that are high in solid fats, added sugars, or sodium.   Maintain a healthy weight Body mass index (BMI) is used to identify weight problems. It estimates body fat based on height and weight. Your health care provider can help determine your BMI and help you achieve or maintain a healthy weight. Get regular exercise Get regular exercise. This is one of the most important things you can do for your health. Most adults should:  Exercise for at least 150 minutes each week. The exercise should increase your heart rate and make you sweat (moderate-intensity exercise).  Do strengthening exercises at least twice a week. This is in addition to the moderate-intensity exercise.  Spend less time sitting. Even light physical activity can be beneficial. Watch cholesterol and blood lipids Have your blood tested for lipids and cholesterol at 38 years of age, then have this test every 5 years. Have your cholesterol levels checked more often if:  Your lipid or cholesterol levels are high.  You are older than 38 years of age.  You are at high risk for heart disease. What should I know about cancer screening? Depending on your health history and family history, you may need to have cancer screening at various ages. This may include screening for:  Breast cancer.  Cervical cancer.  Colorectal cancer.  Skin cancer.  Lung cancer. What should I know about heart disease, diabetes, and high blood  pressure? Blood pressure and heart disease  High blood pressure causes heart disease and increases the risk of stroke. This is more likely to develop in people who have high blood pressure readings, are of African descent, or are overweight.  Have your blood pressure checked: ? Every 3-5 years if you are 18-39 years of age. ? Every year if you are 40 years old or older. Diabetes Have regular diabetes screenings. This checks your fasting blood sugar level. Have the screening done:  Once every three years after age 40 if you are at a normal weight and have a low risk for diabetes.  More often and at a younger age if you are overweight or have a high risk for diabetes. What should I know about preventing infection? Hepatitis B If you have a higher risk for hepatitis B, you should be screened for this virus. Talk with your health care provider to find out if you are at risk for hepatitis B infection. Hepatitis C Testing is recommended for:  Everyone born from 1945 through 1965.  Anyone with known risk factors for hepatitis C. Sexually transmitted infections (STIs)  Get screened for STIs, including gonorrhea and chlamydia, if: ? You are sexually active and are younger than 38 years of age. ? You are older than 38 years of age and your health care provider tells you that you are at risk for this type of infection. ? Your sexual activity has changed since you were last screened, and you are at increased risk for chlamydia or gonorrhea. Ask your health care provider   if you are at risk.  Ask your health care provider about whether you are at high risk for HIV. Your health care provider may recommend a prescription medicine to help prevent HIV infection. If you choose to take medicine to prevent HIV, you should first get tested for HIV. You should then be tested every 3 months for as long as you are taking the medicine. Pregnancy  If you are about to stop having your period (premenopausal) and  you may become pregnant, seek counseling before you get pregnant.  Take 400 to 800 micrograms (mcg) of folic acid every day if you become pregnant.  Ask for birth control (contraception) if you want to prevent pregnancy. Osteoporosis and menopause Osteoporosis is a disease in which the bones lose minerals and strength with aging. This can result in bone fractures. If you are 65 years old or older, or if you are at risk for osteoporosis and fractures, ask your health care provider if you should:  Be screened for bone loss.  Take a calcium or vitamin D supplement to lower your risk of fractures.  Be given hormone replacement therapy (HRT) to treat symptoms of menopause. Follow these instructions at home: Lifestyle  Do not use any products that contain nicotine or tobacco, such as cigarettes, e-cigarettes, and chewing tobacco. If you need help quitting, ask your health care provider.  Do not use street drugs.  Do not share needles.  Ask your health care provider for help if you need support or information about quitting drugs. Alcohol use  Do not drink alcohol if: ? Your health care provider tells you not to drink. ? You are pregnant, may be pregnant, or are planning to become pregnant.  If you drink alcohol: ? Limit how much you use to 0-1 drink a day. ? Limit intake if you are breastfeeding.  Be aware of how much alcohol is in your drink. In the [place] one drink equals one 12 oz bottle of beer (355 mL), one 5 oz glass of wine (148 mL), or one 1 oz glass of hard liquor (44 mL). General instructions  Schedule regular health, dental, and eye exams.  Stay current with your vaccines.  Tell your health care provider if: ? You often feel depressed. ? You have ever been abused or do not feel safe at home. Summary  Adopting a healthy lifestyle and getting preventive care are important in promoting health and wellness.  Follow your health care provider's instructions about healthy  diet, exercising, and getting tested or screened for diseases.  Follow your health care provider's instructions on monitoring your cholesterol and blood pressure. This information is not intended to replace advice given to you by your health care provider. Make sure you discuss any questions you have with your health care provider. Document Revised: 06/12/2018 Document Reviewed: 06/12/2018 Elsevier Patient Education  2021 Elsevier Inc.  

## 2020-08-21 LAB — COMP. METABOLIC PANEL (12)
AST: 22 IU/L (ref 0–40)
Albumin/Globulin Ratio: 1.4 (ref 1.2–2.2)
Albumin: 4.1 g/dL (ref 3.8–4.8)
Alkaline Phosphatase: 36 IU/L — ABNORMAL LOW (ref 44–121)
BUN/Creatinine Ratio: 10 (ref 9–23)
BUN: 11 mg/dL (ref 6–20)
Bilirubin Total: 0.3 mg/dL (ref 0.0–1.2)
Calcium: 9 mg/dL (ref 8.7–10.2)
Chloride: 105 mmol/L (ref 96–106)
Creatinine, Ser: 1.05 mg/dL — ABNORMAL HIGH (ref 0.57–1.00)
GFR calc Af Amer: 78 mL/min/{1.73_m2} (ref >59)
GFR calc non Af Amer: 68 mL/min/{1.73_m2} (ref >59)
Globulin, Total: 2.9 g/dL (ref 1.5–4.5)
Glucose: 90 mg/dL (ref 65–99)
Potassium: 4.1 mmol/L (ref 3.5–5.2)
Sodium: 139 mmol/L (ref 134–144)
Total Protein: 7 g/dL (ref 6.0–8.5)

## 2020-08-21 LAB — LIPID PANEL
Chol/HDL Ratio: 2 ratio (ref 0.0–4.4)
Cholesterol, Total: 146 mg/dL (ref 100–199)
HDL: 72 mg/dL (ref >39)
LDL Chol Calc (NIH): 64 mg/dL (ref 0–99)
Triglycerides: 46 mg/dL (ref 0–149)
VLDL Cholesterol Cal: 10 mg/dL (ref 5–40)

## 2020-08-21 LAB — STD SCREEN (8)
HIV Screen 4th Generation wRfx: NONREACTIVE
HSV 1 Glycoprotein G Ab, IgG: <0.91 index (ref 0.00–0.90)
HSV 2 IgG, Type Spec: >23.6 index — ABNORMAL HIGH (ref 0.00–0.90)
Hep A IgM: NEGATIVE
Hep B C IgM: NEGATIVE
Hep C Virus Ab: <0.1 s/co ratio (ref 0.0–0.9)
Hepatitis B Surface Ag: NEGATIVE
RPR Ser Ql: NONREACTIVE

## 2020-08-23 LAB — CHLAMYDIA/GONOCOCCUS/TRICHOMONAS, NAA
Chlamydia by NAA: NEGATIVE
Gonococcus by NAA: NEGATIVE
Trich vag by NAA: NEGATIVE

## 2020-08-26 ENCOUNTER — Other Ambulatory Visit: Payer: Self-pay

## 2020-08-30 ENCOUNTER — Encounter: Payer: Self-pay | Admitting: Nurse Practitioner

## 2020-08-30 DIAGNOSIS — R7689 Other specified abnormal immunological findings in serum: Secondary | ICD-10-CM | POA: Insufficient documentation

## 2020-08-30 DIAGNOSIS — R768 Other specified abnormal immunological findings in serum: Secondary | ICD-10-CM | POA: Insufficient documentation

## 2020-08-31 ENCOUNTER — Other Ambulatory Visit: Payer: Self-pay

## 2020-08-31 MED ORDER — ETONOGESTREL-ETHINYL ESTRADIOL 0.12-0.015 MG/24HR VA RING: VAGINAL_RING | VAGINAL | 12 refills | Status: DC

## 2020-09-08 ENCOUNTER — Other Ambulatory Visit: Payer: Self-pay | Admitting: Nurse Practitioner

## 2020-09-08 DIAGNOSIS — F32A Depression, unspecified: Secondary | ICD-10-CM

## 2020-09-08 DIAGNOSIS — M25562 Pain in left knee: Secondary | ICD-10-CM

## 2020-09-08 NOTE — Progress Notes (Signed)
   [name] [address] Anastasia Pall [place], [place]  [address] Phone:  [phone]   Fax:  [phone]  Patient is having left knee pain.

## 2020-10-05 ENCOUNTER — Telehealth (HOSPITAL_BASED_OUTPATIENT_CLINIC_OR_DEPARTMENT_OTHER): Payer: Self-pay | Admitting: Emergency Medicine

## 2020-10-05 ENCOUNTER — Other Ambulatory Visit: Payer: Self-pay

## 2020-10-05 ENCOUNTER — Emergency Department (HOSPITAL_BASED_OUTPATIENT_CLINIC_OR_DEPARTMENT_OTHER)
Admission: EM | Admit: 2020-10-05 | Discharge: 2020-10-05 | Disposition: A | Discharge status: Home / Self Care | Payer: Medicaid Other | Attending: Emergency Medicine | Admitting: Emergency Medicine

## 2020-10-05 ENCOUNTER — Encounter (HOSPITAL_BASED_OUTPATIENT_CLINIC_OR_DEPARTMENT_OTHER): Payer: Self-pay | Admitting: Emergency Medicine

## 2020-10-05 ENCOUNTER — Emergency Department (HOSPITAL_BASED_OUTPATIENT_CLINIC_OR_DEPARTMENT_OTHER): Payer: Medicaid Other

## 2020-10-05 DIAGNOSIS — J45909 Unspecified asthma, uncomplicated: Secondary | ICD-10-CM | POA: Diagnosis not present

## 2020-10-05 DIAGNOSIS — M25511 Pain in right shoulder: Secondary | ICD-10-CM | POA: Insufficient documentation

## 2020-10-05 HISTORY — DX: Unspecified asthma, uncomplicated: J45.909

## 2020-10-05 MED ORDER — NAPROXEN 500 MG PO TABS: 500.0000 mg | ORAL_TABLET | Freq: Two times a day (BID) | ORAL | 0 refills | Status: DC

## 2020-10-05 NOTE — ED Notes (Signed)
Pt left without discharge instructions.  Charge RN called to go over and let her know her work note is still here.

## 2020-10-05 NOTE — ED Provider Notes (Signed)
[facility] EMERGENCY DEPARTMENT Provider Note   CSN: [id number] Arrival date & time: 10/05/20  5643     History Chief Complaint  Patient presents with  . Shoulder Pain    Gabrielle Zimmerman is a 40 y.o. adult.  Patient with a complaint of right shoulder pain for about 2 days.  Was irritated upon awakening 1 morning.  But this morning it hurt actually more.  No known injury.  No problem in the past.  Denies any numbness or weakness to her hand.        Past Medical History:  Diagnosis Date  . Anemia   . Asthma   . Ovarian cyst     Patient Active Problem List   Diagnosis Date Noted  . 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 Ovarian cyst rupture 07/13/2020  . Contraceptive surveillance 07/13/2020  . H/O LEEP 07/13/2020  . Anemia 06/24/2020    Past Surgical History:  Procedure Laterality Date  . LEEP       OB History    Gravida  6   Para  3   Term  3   Preterm      AB  3   Living        SAB      IAB  3   Ectopic      Multiple      Live Births              Family History  Problem Relation Age of Onset  . Healthy Mother   . Diabetes Father     Social History   Tobacco Use  . Smoking status: Never Smoker  . Smokeless tobacco: Never Used  Vaping Use  . Vaping Use: Never used  Substance Use Topics  . Alcohol use: Yes    Comment: weekends  . Drug use: Never    Home Medications Prior to Admission medications   Medication Sig Start Date End Date Taking? Authorizing Provider  naproxen (NAPROSYN) 500 MG tablet Take 1 tablet (500 mg total) by mouth 2 (two) times daily. 10/05/20  Yes Vanetta Mulders, MD  etonogestrel-ethinyl estradiol (NUVARING) 0.12-0.015 MG/24HR vaginal ring Place 1 each vaginally every 28 (twenty-eight) days. Insert vaginally and leave in place for 3 consecutive weeks, then remove for 1 week.    [provider]  etonogestrel-ethinyl estradiol (NUVARING) 0.12-0.015 MG/24HR vaginal ring Insert vaginally and  leave in place for 3 consecutive weeks, then remove for 1 week. 08/31/20   Aviva Signs, CNM    Allergies    Pork (porcine) protein and Pork-derived products  Review of Systems   Review of Systems  Constitutional: Negative for chills and fever.  HENT: Negative for rhinorrhea and sore throat.   Eyes: Negative for visual disturbance.  Respiratory: Negative for cough and shortness of breath.   Cardiovascular: Negative for chest pain and leg swelling.  Gastrointestinal: Negative for abdominal pain, diarrhea, nausea and vomiting.  Genitourinary: Negative for dysuria.  Musculoskeletal: Negative for back pain, joint swelling and neck pain.  Skin: Negative for rash.  Neurological: Negative for dizziness, light-headedness and headaches.  Hematological: Does not bruise/bleed easily.  Psychiatric/Behavioral: Negative for confusion.    Physical Exam Updated Vital Signs BP 123/79   Pulse 82   Temp 98.3 F (36.8 C) (Oral)   Resp 18   SpO2 100%   Physical Exam Vitals and nursing note reviewed.  Constitutional:      General: She is not in acute distress.    Appearance: Normal  appearance. She is well-developed.  HENT:     Head: Normocephalic and atraumatic.  Eyes:     Extraocular Movements: Extraocular movements intact.     Conjunctiva/sclera: Conjunctivae normal.  Cardiovascular:     Rate and Rhythm: Normal rate and regular rhythm.     Heart sounds: No murmur heard.   Pulmonary:     Effort: Pulmonary effort is normal. No respiratory distress.     Breath sounds: Normal breath sounds.  Abdominal:     Palpations: Abdomen is soft.     Tenderness: There is no abdominal tenderness.  Musculoskeletal:        General: Tenderness present. No swelling or deformity.     Cervical back: Neck supple.     Comments: Right upper extremity good movement at the wrist fingers and elbow.  Pain with range of motion of the shoulder.  Particularly when raising arm above head.  Radial pulses 2+ cap  refill is less than 2 seconds.  Sensation intact to the fingers.  Good range of motion of fingers.  Skin:    General: Skin is warm and dry.     Capillary Refill: Capillary refill takes less than 2 seconds.  Neurological:     General: No focal deficit present.     Mental Status: She is alert and oriented to person, place, and time.     Cranial Nerves: No cranial nerve deficit.     Sensory: No sensory deficit.     ED Results / Procedures / Treatments   Labs (all labs ordered are listed, but only abnormal results are displayed) Labs Reviewed - No data to display  EKG None  Radiology DG Shoulder Right  Result Date: 10/05/2020 CLINICAL DATA:  Right shoulder pain for the past 3 days EXAM: RIGHT SHOULDER - 2+ VIEW COMPARISON:  None. FINDINGS: There is no evidence of fracture or dislocation. There is no evidence of arthropathy or other focal bone abnormality. Soft tissues are unremarkable. IMPRESSION: Negative. Electronically Signed   By: Malachy Moan M.D.   On: 10/05/2020 10:03    Procedures Procedures  Medications Ordered in ED Medications - No data to display  ED Course  I have reviewed the triage vital signs and the nursing notes.  Pertinent labs & imaging results that were available during my care of the patient were reviewed by me and considered in my medical decision making (see chart for details).    MDM Rules/Calculators/A&P                         Clinically suspicious for rotator cuff injury.  With pretty good range of motion.  Will treat with a sling.  X-rays negative for any bony abnormalities.  And give follow-up with sports medicine.  And Naprosyn as needed for pain.  Final Clinical Impression(s) / ED Diagnoses Final diagnoses:  Acute pain of right shoulder    Rx / DC Orders ED Discharge Orders         Ordered    naproxen (NAPROSYN) 500 MG tablet  2 times daily        10/05/20 1014           Vanetta Mulders, MD 10/05/20 1023

## 2020-10-05 NOTE — Discharge Instructions (Addendum)
X-rays of the shoulder without any acute findings.  Make an appointment to follow-up with sports medicine.  Work note provided.  Take the Naprosyn as directed prescription provided.  But also available over-the-counter.

## 2020-10-05 NOTE — ED Triage Notes (Signed)
Pt arrives pov with c/o R shoulder pain x 3 days, endorses difficulty raising arm. Pt denie CP, denies injury

## 2020-10-05 NOTE — ED Notes (Signed)
Pt discharged to home. Discharge instructions have been discussed with patient and/or family members via phone call. Pt verbally acknowledges understanding d/c instructions, and endorses comprehension to checkout at registration before leaving.

## 2020-10-12 ENCOUNTER — Ambulatory Visit: Payer: Self-pay

## 2020-10-12 ENCOUNTER — Ambulatory Visit (INDEPENDENT_AMBULATORY_CARE_PROVIDER_SITE_OTHER): Payer: Medicaid Other | Admitting: Family Medicine

## 2020-10-12 ENCOUNTER — Encounter: Payer: Self-pay | Admitting: Family Medicine

## 2020-10-12 ENCOUNTER — Other Ambulatory Visit: Payer: Self-pay

## 2020-10-12 VITALS — BP 110/76 | Ht 68.0 in | Wt 169.0 lb

## 2020-10-12 DIAGNOSIS — M25511 Pain in right shoulder: Secondary | ICD-10-CM

## 2020-10-12 DIAGNOSIS — M5412 Radiculopathy, cervical region: Secondary | ICD-10-CM

## 2020-10-12 NOTE — Progress Notes (Signed)
Gabrielle Zimmerman - 40 y.o. female MRN [id number]  Date of birth: [birth year]  SUBJECTIVE:  Including CC & ROS.  No chief complaint on file.   Gabrielle Zimmerman is a 40 y.o. female that is presenting with right shoulder and arm pain.  Pain occurred with no inciting event or trauma.  Has history of dislocation from several years ago.  Pain does seem to be improving.  She has taken naproxen..  Independent review of the right shoulder x-ray from 4/5 shows no acute changes.   Review of Systems See HPI   HISTORY: Past Medical, Surgical, Social, and Family History Reviewed & Updated per EMR.   Pertinent Historical Findings include:  Past Medical History:  Diagnosis Date  . Anemia   . Asthma   . Ovarian cyst     Past Surgical History:  Procedure Laterality Date  . LEEP      Family History  Problem Relation Age of Onset  . Healthy Mother   . Diabetes Father     Social History   Socioeconomic History  . Marital status: Divorced    Spouse name: Not on file  . Number of children: 3  . Years of education: Bachelor's degree  . Highest education level: Bachelor's degree (e.g., BA, AB, BS)  Occupational History  . Occupation: Nutritionist and Systems analyst  Tobacco Use  . Smoking status: Never Smoker  . Smokeless tobacco: Never Used  Vaping Use  . Vaping Use: Never used  Substance and Sexual Activity  . Alcohol use: Yes    Comment: weekends  . Drug use: Never  . Sexual activity: Yes    Birth control/protection: None    Comment: Nuva-ring  Other Topics Concern  . Not on file  Social History Narrative  . Not on file   Social Determinants of Health   Financial Resource Strain: Medium Risk  . Difficulty of Paying Living Expenses: Somewhat hard  Food Insecurity: No Food Insecurity  . Worried About Programme researcher, broadcasting/film/video in the Last Year: Never true  . Ran Out of Food in the Last Year: Never true  Transportation Needs: No Transportation Needs  . Lack of Transportation  (Medical): No  . Lack of Transportation (Non-Medical): No  Physical Activity: Sufficiently Active  . Days of Exercise per Week: 5 days  . Minutes of Exercise per Session: 50 min  Stress: No Stress Concern Present  . Feeling of Stress : Not at all  Social Connections: Socially Isolated  . Frequency of Communication with Friends and Family: More than three times a week  . Frequency of Social Gatherings with Friends and Family: More than three times a week  . Attends Religious Services: Never  . Active Member of Clubs or Organizations: No  . Attends Banker Meetings: Never  . Marital Status: Divorced  Catering manager Violence: Not At Risk  . Fear of Current or Ex-Partner: No  . Emotionally Abused: No  . Physically Abused: No  . Sexually Abused: No     PHYSICAL EXAM:  VS: BP 110/76 (BP Location: Right Arm, Patient Position: Sitting, Cuff Size: Large)   Ht 5\' 8"  (1.727 m)   Wt 169 lb (76.7 kg)   BMI 25.70 kg/m  Physical Exam Gen: NAD, alert, cooperative with exam, well-appearing MSK:  Right shoulder: Normal range of motion. Normal strength resistance. Normal empty can test. Normal speeds test. Neurovascular intact  Limited ultrasound: Right shoulder:  Normal biceps tendon. Normal subscapularis and static and dynamic testing.  Normal supraspinatus and static and dynamic testing. Normal-appearing posterior glenohumeral joint.  Summary: No structural changes appreciated  Ultrasound and interpretation by Clare Gandy, MD    ASSESSMENT & PLAN:   Cervical radiculopathy Symptoms seem more consistent with radiculopathy given the reassuring exam of her shoulder. -Counseled on home exercise therapy and supportive care. -Could consider physical therapy

## 2020-10-12 NOTE — Patient Instructions (Signed)
Nice to meet you Please try the exercises  Please try heat as needed   Please send me a message in MyChart with any questions or updates.  Please see me back in 4 weeks or as needed if better.   --Dr. Jordan Likes

## 2020-10-13 DIAGNOSIS — M5412 Radiculopathy, cervical region: Secondary | ICD-10-CM | POA: Insufficient documentation

## 2020-10-13 NOTE — Assessment & Plan Note (Signed)
Symptoms seem more consistent with radiculopathy given the reassuring exam of her shoulder. -Counseled on home exercise therapy and supportive care. -Could consider physical therapy

## 2020-10-25 ENCOUNTER — Other Ambulatory Visit: Payer: Self-pay
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
## 2020-10-25 NOTE — Patient Outreach (Signed)
  Medicaid Managed Care Social Work Note  10/25/2020 Name:  Gabrielle Zimmerman MRN:  [id number] DOB:  10/07/1980  Gabrielle Zimmerman is an 40 y.o. year old female who is a primary patient of Barbette Merino, NP.  The Medicaid Managed Care Coordination team was consulted for assistance with:  Mental Health Counseling and Resources  Ms. Wiland was given information about Medicaid Managed CareCoordination services today. Tatumn C Driggers agreed to services and verbal consent obtained.  Engaged with patient  for by telephone forfollow up visit in response to referral for case management and/or care coordination services.   Assessments/Interventions:  Review of past medical history, allergies, medications, health status, including review of consultants reports, laboratory and other test data, was performed as part of comprehensive evaluation and provision of chronic care management services.  SDOH: (Social Determinant of Health) assessments and interventions performed:  BSW contacted patient regarding therapy for her children. BSW asked if she tried [facility] like discussed in feb 22. Patient stated due to her schedule she would prefer somewhere that she can make an appointment. Since [facility] is walk in only that would not work for her and the children. Patient stated she wanted her children to attend therapy just as an outlet. Patient requested BSW make the appointment for the children. Patient provided BSW will children's information Gabrielle Zimmerman 07/17/06, Gabrielle Zimmerman 12/01/08 and Gabrielle Zimmerman 12/26/09. BSW will contact [facility] to have children scheduled for an appointment.   Advanced Directives Status:  Not addressed in this encounter.  Care Plan                 Allergies  Allergen Reactions  . Pork (Porcine) Protein Nausea And Vomiting  . Pork-Derived Products Nausea And Vomiting, Diarrhea and Other (See Comments)    Stomach cramps/gas     Medications Reviewed Today    Reviewed by  Myra Rude, MD (Physician) on 10/13/20 at 0816  Med List Status: <None>  Medication Order Taking? Sig Documenting Provider Last Dose Status Informant  etonogestrel-ethinyl estradiol (NUVARING) 0.12-0.015 MG/24HR vaginal ring [id number]  Place 1 each vaginally every 28 (twenty-eight) days. Insert vaginally and leave in place for 3 consecutive weeks, then remove for 1 week. [provider]  Active   etonogestrel-ethinyl estradiol (NUVARING) 0.12-0.015 MG/24HR vaginal ring [id number]  Insert vaginally and leave in place for 3 consecutive weeks, then remove for 1 week. Aviva Signs, CNM  Active   naproxen (NAPROSYN) 500 MG tablet [id number]  Take 1 tablet (500 mg total) by mouth 2 (two) times daily. Vanetta Mulders, MD  Active           Patient Active Problem List   Diagnosis Date Noted  . Cervical radiculopathy 10/13/2020  . 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 Ovarian cyst rupture 07/13/2020  . Contraceptive surveillance 07/13/2020  . H/O LEEP 07/13/2020  . Anemia 06/24/2020    Conditions to be addressed/monitored per PCP order:  mental health resources for children  There are no care plans that you recently modified to display for this patient.   Follow up:  Patient agrees to Care Plan and Follow-up.  Plan: The Managed Medicaid care management team will reach out to the patient again over the next 14 days.  Date/time of next scheduled Social Work care management/care coordination outreach:  11/12/20  Gus Puma, BSW, [facility]  [place]  High Risk Managed Medicaid Team

## 2020-10-28 ENCOUNTER — Other Ambulatory Visit: Payer: Self-pay

## 2020-10-28 ENCOUNTER — Other Ambulatory Visit: Payer: Self-pay | Admitting: *Deleted

## 2020-10-28 NOTE — Patient Outreach (Signed)
Care Coordination  10/28/2020  Gabrielle Zimmerman 05-07-1981 [id number]   Gabrielle Zimmerman was referred to the [facility] Managed Care High Risk team for assistance with care coordination and care management services. Care coordination/care management services as part of the Medicaid benefit was offered to the patient today. The patient declined assistance offered today.   Plan: The Medicaid Managed Care High Risk team is available at any time in the future to assist with care coordination/care management services upon referral.   Estanislado Emms RN, BSN [place]  [facility] RN Care Coordinator

## 2020-11-08 ENCOUNTER — Ambulatory Visit: Payer: Medicaid Other | Admitting: Family Medicine

## 2020-11-10 ENCOUNTER — Encounter: Payer: Self-pay | Admitting: Family Medicine

## 2020-11-10 ENCOUNTER — Ambulatory Visit (INDEPENDENT_AMBULATORY_CARE_PROVIDER_SITE_OTHER): Payer: Medicaid Other | Admitting: Family Medicine

## 2020-11-10 ENCOUNTER — Other Ambulatory Visit: Payer: Self-pay

## 2020-11-10 DIAGNOSIS — M5412 Radiculopathy, cervical region: Secondary | ICD-10-CM

## 2020-11-10 NOTE — Patient Instructions (Signed)
Good to see you  Please send me a message in MyChart with any questions or updates.  Please see us back as needed.   --Dr. Timm Bonenberger  

## 2020-11-10 NOTE — Progress Notes (Signed)
  MIIA BLANKS - 40 y.o. female MRN [id number]  Date of birth: 1981/06/13  SUBJECTIVE:  Including CC & ROS.  No chief complaint on file.   Gabrielle Zimmerman is a 40 y.o. female that is following up for right arm pain.  She has done well with no pain today.  She feels like she is back to her normal self.   Review of Systems See HPI   HISTORY: Past Medical, Surgical, Social, and Family History Reviewed & Updated per EMR.   Pertinent Historical Findings include:  Past Medical History:  Diagnosis Date  . Anemia   . Asthma   . Ovarian cyst     Past Surgical History:  Procedure Laterality Date  . LEEP      Family History  Problem Relation Age of Onset  . Healthy Mother   . Diabetes Father     Social History   Socioeconomic History  . Marital status: Divorced    Spouse name: Not on file  . Number of children: 3  . Years of education: Bachelor's degree  . Highest education level: Bachelor's degree (e.g., BA, AB, BS)  Occupational History  . Occupation: Nutritionist and Systems analyst  Tobacco Use  . Smoking status: Never Smoker  . Smokeless tobacco: Never Used  Vaping Use  . Vaping Use: Never used  Substance and Sexual Activity  . Alcohol use: Yes    Comment: weekends  . Drug use: Never  . Sexual activity: Yes    Birth control/protection: None    Comment: Nuva-ring  Other Topics Concern  . Not on file  Social History Narrative  . Not on file   Social Determinants of Health   Financial Resource Strain: Medium Risk  . Difficulty of Paying Living Expenses: Somewhat hard  Food Insecurity: No Food Insecurity  . Worried About Programme researcher, broadcasting/film/video in the Last Year: Never true  . Ran Out of Food in the Last Year: Never true  Transportation Needs: No Transportation Needs  . Lack of Transportation (Medical): No  . Lack of Transportation (Non-Medical): No  Physical Activity: Sufficiently Active  . Days of Exercise per Week: 5 days  . Minutes of Exercise per  Session: 50 min  Stress: No Stress Concern Present  . Feeling of Stress : Not at all  Social Connections: Socially Isolated  . Frequency of Communication with Friends and Family: More than three times a week  . Frequency of Social Gatherings with Friends and Family: More than three times a week  . Attends Religious Services: Never  . Active Member of Clubs or Organizations: No  . Attends Banker Meetings: Never  . Marital Status: Divorced  Catering manager Violence: Not At Risk  . Fear of Current or Ex-Partner: No  . Emotionally Abused: No  . Physically Abused: No  . Sexually Abused: No     PHYSICAL EXAM:  VS: BP 112/80 (BP Location: Left Arm, Patient Position: Sitting, Cuff Size: Large)   Ht 5\' 8"  (1.727 m)   Wt 169 lb (76.7 kg)   BMI 25.70 kg/m  Physical Exam Gen: NAD, alert, cooperative with exam, well-appearing    ASSESSMENT & PLAN:   Cervical radiculopathy Resolution of her arm pain and neck pain. -Counseled on home exercise therapy and supportive care. -Could consider physical therapy

## 2020-11-11 NOTE — Assessment & Plan Note (Signed)
Resolution of her arm pain and neck pain. -Counseled on home exercise therapy and supportive care. -Could consider physical therapy

## 2021-05-20 ENCOUNTER — Encounter: Payer: Self-pay | Admitting: General Practice

## 2021-07-03 ENCOUNTER — Emergency Department (HOSPITAL_BASED_OUTPATIENT_CLINIC_OR_DEPARTMENT_OTHER)
Admission: EM | Admit: 2021-07-03 | Discharge: 2021-07-03 | Disposition: A | Discharge status: Home / Self Care | Payer: Medicaid Other | Attending: Emergency Medicine | Admitting: Emergency Medicine

## 2021-07-03 ENCOUNTER — Encounter (HOSPITAL_BASED_OUTPATIENT_CLINIC_OR_DEPARTMENT_OTHER): Payer: Self-pay | Admitting: *Deleted

## 2021-07-03 ENCOUNTER — Emergency Department (HOSPITAL_BASED_OUTPATIENT_CLINIC_OR_DEPARTMENT_OTHER): Payer: Medicaid Other

## 2021-07-03 ENCOUNTER — Other Ambulatory Visit: Payer: Self-pay

## 2021-07-03 DIAGNOSIS — F419 Anxiety disorder, unspecified: Secondary | ICD-10-CM | POA: Diagnosis present

## 2021-07-03 DIAGNOSIS — F410 Panic disorder [episodic paroxysmal anxiety] without agoraphobia: Secondary | ICD-10-CM | POA: Insufficient documentation

## 2021-07-03 DIAGNOSIS — R0602 Shortness of breath: Secondary | ICD-10-CM

## 2021-07-03 DIAGNOSIS — N9489 Other specified conditions associated with female genital organs and menstrual cycle: Secondary | ICD-10-CM | POA: Insufficient documentation

## 2021-07-03 DIAGNOSIS — R079 Chest pain, unspecified: Secondary | ICD-10-CM

## 2021-07-03 LAB — CBC WITH DIFFERENTIAL/PLATELET
Abs Immature Granulocytes: 0.02 10*3/uL (ref 0.00–0.07)
Basophils Absolute: 0.1 10*3/uL (ref 0.0–0.1)
Basophils Relative: 1 %
Eosinophils Absolute: 0.1 10*3/uL (ref 0.0–0.5)
Eosinophils Relative: 1 %
HCT: 33.2 % — ABNORMAL LOW (ref 36.0–46.0)
Hemoglobin: 11.3 g/dL — ABNORMAL LOW (ref 12.0–15.0)
Immature Granulocytes: 0 %
Lymphocytes Relative: 26 %
Lymphs Abs: 1.9 10*3/uL (ref 0.7–4.0)
MCH: 33 pg (ref 26.0–34.0)
MCHC: 34 g/dL (ref 30.0–36.0)
MCV: 97.1 fL (ref 80.0–100.0)
Monocytes Absolute: 0.5 10*3/uL (ref 0.1–1.0)
Monocytes Relative: 7 %
Neutro Abs: 4.7 10*3/uL (ref 1.7–7.7)
Neutrophils Relative %: 65 %
Platelets: 267 10*3/uL (ref 150–400)
RBC: 3.42 MIL/uL — ABNORMAL LOW (ref 3.87–5.11)
RDW: 12.1 % (ref 11.5–15.5)
WBC: 7.3 10*3/uL (ref 4.0–10.5)
nRBC: 0 % (ref 0.0–0.2)

## 2021-07-03 LAB — BASIC METABOLIC PANEL
Anion gap: 11 (ref 5–15)
BUN: 12 mg/dL (ref 6–20)
CO2: 20 mmol/L — ABNORMAL LOW (ref 22–32)
Calcium: 8.7 mg/dL — ABNORMAL LOW (ref 8.9–10.3)
Chloride: 109 mmol/L (ref 98–111)
Creatinine, Ser: 0.79 mg/dL (ref 0.44–1.00)
GFR, Estimated: >60 mL/min (ref >60)
Glucose, Bld: 93 mg/dL (ref 70–99)
Potassium: 3.7 mmol/L (ref 3.5–5.1)
Sodium: 140 mmol/L (ref 135–145)

## 2021-07-03 LAB — HCG, SERUM, QUALITATIVE: Preg, Serum: NEGATIVE

## 2021-07-03 MED ORDER — LORAZEPAM 2 MG/ML IJ SOLN
1.0000 mg | Freq: Once | INTRAMUSCULAR | Status: AC
  Administered 2021-07-03: 1 mg via INTRAVENOUS
  Filled 2021-07-03: qty 1

## 2021-07-03 MED ORDER — SODIUM CHLORIDE 0.9 % IV BOLUS (SEPSIS)
1000.0000 mL | Freq: Once | INTRAVENOUS | Status: AC
  Administered 2021-07-03: 1000 mL via INTRAVENOUS

## 2021-07-03 NOTE — ED Notes (Signed)
Portable Xray at bedside.

## 2021-07-03 NOTE — ED Notes (Signed)
EDP Charm Barges updated that pt alert and on phone

## 2021-07-03 NOTE — ED Notes (Signed)
ED Provider at bedside. 

## 2021-07-03 NOTE — ED Notes (Signed)
Pt endorses etoh around 0100 today.

## 2021-07-03 NOTE — ED Provider Notes (Signed)
[facility] Provider Note   CSN: [id number] Arrival date & time: 07/03/21  1027     History  Chief Complaint  Patient presents with   Anxiety    Gabrielle Zimmerman is a 41 y.o. female.  She has no significant past medical history.  She has been under a lot of stress.  Since last evening she has been feeling more anxious.  Woke up this morning with similar sensation chest soreness shortness of breath.  She said this is happened before but not in a long time.  Reportedly had an argument with her significant other last evening.  No nausea vomiting diarrhea.  No fevers or chills.  No urinary symptoms.  Last menstrual period was about 1 month ago.  The history is provided by the patient.  Anxiety This is a recurrent problem. The current episode started yesterday. The problem occurs constantly. The problem has not changed since onset.Associated symptoms include chest pain and shortness of breath. Pertinent negatives include no abdominal pain and no headaches. Nothing aggravates the symptoms. Nothing relieves the symptoms. She has tried rest for the symptoms. The treatment provided no relief.      Home Medications Prior to Admission medications   Medication Sig Start Date End Date Taking? Authorizing Provider  etonogestrel-ethinyl estradiol (NUVARING) 0.12-0.015 MG/24HR vaginal ring Place 1 each vaginally every 28 (twenty-eight) days. Insert vaginally and leave in place for 3 consecutive weeks, then remove for 1 week.    [provider]  etonogestrel-ethinyl estradiol (NUVARING) 0.12-0.015 MG/24HR vaginal ring Insert vaginally and leave in place for 3 consecutive weeks, then remove for 1 week. 08/31/20   Seabron Spates, CNM  naproxen (NAPROSYN) 500 MG tablet Take 1 tablet (500 mg total) by mouth 2 (two) times daily. 10/05/20   Fredia Sorrow, MD      Allergies    Pork (porcine) protein and Pork-derived products    Review of Systems   Review of Systems   Constitutional:  Negative for fever.  HENT:  Negative for sore throat.   Eyes:  Negative for visual disturbance.  Respiratory:  Positive for chest tightness and shortness of breath.   Cardiovascular:  Positive for chest pain.  Gastrointestinal:  Negative for abdominal pain.  Genitourinary:  Negative for dysuria.  Musculoskeletal:  Negative for neck pain.  Skin:  Negative for rash.  Neurological:  Negative for headaches.   Physical Exam Updated Vital Signs BP (!) 172/97 (BP Location: Right Arm)    Pulse (!) 107    Temp 98.2 F (36.8 C) (Oral)    Resp (!) 24    Wt 77.1 kg    SpO2 100%    BMI 25.85 kg/m  Physical Exam Vitals and nursing note reviewed.  Constitutional:      General: She is in acute distress (Tearful).     Appearance: Normal appearance. She is well-developed. She is not ill-appearing.  HENT:     Head: Normocephalic and atraumatic.  Eyes:     Conjunctiva/sclera: Conjunctivae normal.  Cardiovascular:     Rate and Rhythm: Regular rhythm. Tachycardia present.     Heart sounds: No murmur heard. Pulmonary:     Effort: Pulmonary effort is normal. Tachypnea present. No respiratory distress.     Breath sounds: Normal breath sounds.  Abdominal:     Palpations: Abdomen is soft.     Tenderness: There is no abdominal tenderness.  Musculoskeletal:        General: No swelling.  Cervical back: Neck supple.     Right lower leg: No edema.     Left lower leg: No edema.  Skin:    General: Skin is warm and dry.     Capillary Refill: Capillary refill takes less than 2 seconds.  Neurological:     General: No focal deficit present.     Mental Status: She is alert.    ED Results / Procedures / Treatments   Labs (all labs ordered are listed, but only abnormal results are displayed) Labs Reviewed  BASIC METABOLIC PANEL - Abnormal; Notable for the following components:      Result Value   CO2 20 (*)    Calcium 8.7 (*)    All other components within normal limits  CBC WITH  DIFFERENTIAL/PLATELET - Abnormal; Notable for the following components:   RBC 3.42 (*)    Hemoglobin 11.3 (*)    HCT 33.2 (*)    All other components within normal limits  HCG, SERUM, QUALITATIVE    EKG EKG Interpretation  Date/Time:  Sunday July 03 2021 10:46:30 EST Ventricular Rate:  100 PR Interval:  213 QRS Duration: 95 QT Interval:  345 QTC Calculation: 445 R Axis:   55 Text Interpretation: Sinus tachycardia Prolonged PR interval ST elevation suggests acute pericarditis No significant change since prior 2/21 Confirmed by Aletta Edouard [phone]) on 07/03/2021 12:49:26 PM  Radiology DG Chest Port 1 View  Result Date: 07/03/2021 CLINICAL DATA:  41 year old female with history of shortness of breath. EXAM: PORTABLE CHEST 1 VIEW COMPARISON:  Chest x-ray 09/01/2013. FINDINGS: Lung volumes are normal. No consolidative airspace disease. No pleural effusions. No pneumothorax. No pulmonary nodule or mass noted. Pulmonary vasculature and the cardiomediastinal silhouette are within normal limits. IMPRESSION: No radiographic evidence of acute cardiopulmonary disease. Electronically Signed   By: Vinnie Langton M.D.   On: 07/03/2021 11:21    Procedures Procedures    Medications Ordered in ED Medications  LORazepam (ATIVAN) injection 1 mg (has no administration in time range)    ED Course/ Medical Decision Making/ A&P Clinical Course as of 07/03/21 1732  Sun Jul 03, 2021  1104 Chest x-ray interpreted by me as no acute infiltrates.  Awaiting radiology reading. [MB]  1217 Reevaluation-patient is resting comfortably. [MB]    Clinical Course User Index [MB] Hayden Rasmussen, MD                           Medical Decision Making   This patient presents to the ED for concern of anxiety shortness of breath racing heart, this involves an extensive number of treatment options, and is a complaint that carries with it a high risk of complications and morbidity.  The differential diagnosis  includes panic attack, anxiety, dehydration, arrhythmia, anemia, metabolic derangement, infection   Additional history obtained:  Additional history obtained from patient significant other External records from outside source obtained and reviewed including in epic, no recent admissions   Lab Tests:  I Ordered, reviewed, and interpreted labs.  The pertinent results include: CBC with normal white count, hemoglobin slightly lower than baseline, chemistries with low bicarb reflecting some dehydration, pregnancy test negative   Imaging Studies ordered:  I ordered imaging studies including chest x-ray I independently visualized and interpreted imaging which showed no acute findings I agree with the radiologist interpretation   Cardiac Monitoring:  The patient was maintained on a cardiac monitor.  I personally viewed and interpreted the cardiac monitored which  showed an underlying rhythm of: sinus tachycardia   Medicines ordered and prescription drug management:  I ordered medication including IV fluids and IV Ativan for patient's tachycardia and anxiety Reevaluation of the patient after these medicines showed that the patient improved I have reviewed the patients home medicines and have made adjustments as needed    Problem List / ED Course:     Reevaluation:  After the interventions noted above, I reevaluated the patient and found that they have :improved   Dispostion:  After consideration of the diagnostic results and the patients response to treatment feel that the patent would benefit from outpatient follow-up with her primary care doctor.  Recommended continued rest and hydration.  Return instructions discussed.         Final Clinical Impression(s) / ED Diagnoses Final diagnoses:  Nonspecific chest pain  Shortness of breath  Panic attack    Rx / DC Orders ED Discharge Orders     None         Hayden Rasmussen, MD 07/03/21 1735

## 2021-07-03 NOTE — ED Notes (Signed)
Pt endorses feeling "dehydrated"

## 2021-07-03 NOTE — Discharge Instructions (Signed)
You were seen in the emergency department for chest tightness and shortness of breath.  You had lab work chest x-ray and EKG that did not show any significant findings.  Your symptoms improved with some fluids and mild sedative.  Please contact your primary care doctor for close follow-up.  Return to the emergency department if any worsening or concerning symptoms.

## 2021-07-03 NOTE — ED Triage Notes (Signed)
Pt had an argument with her significant other yesterday and when she woke up she noted that her heart felt like it was racing (her apple watch showed a HR of 150).  Pt arrives crying and frightened and HR is 110 on arrival, tachypneic she states that she is having a panic attack

## 2021-07-03 NOTE — ED Notes (Signed)
Pt awake, alert, on phone. Resp WNL @15 

## 2021-07-24 IMAGING — CT CT ABD-PELV W/ CM
2 of 4 series · 16 of 46 positions shown, 18 images · IV contrast (Omnipaque)
Comparison: None.

CLINICAL DATA: Right lower quadrant abdominal pain

EXAM:
CT ABDOMEN AND PELVIS WITH CONTRAST
TECHNIQUE: Multidetector CT imaging of the abdomen and pelvis was performed
using the standard protocol following bolus administration of
intravenous contrast.
CONTRAST:  100mL OMNIPAQUE IOHEXOL 300 MG/ML SOLN, back and
abdominal pain, nausea

[Series 2: axial st · axial · 0.96mm/px · z∈[-456,-51]mm · 13 of 89 slices shown, 15 images]
[im 4/89  soft-tissue]
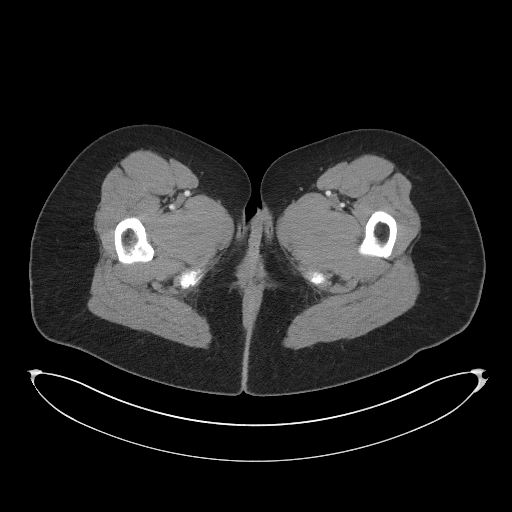
[im 4/89  bone]
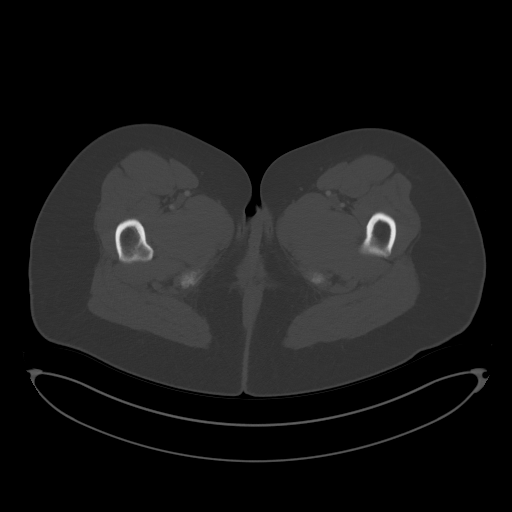
[im 11/89  soft-tissue]
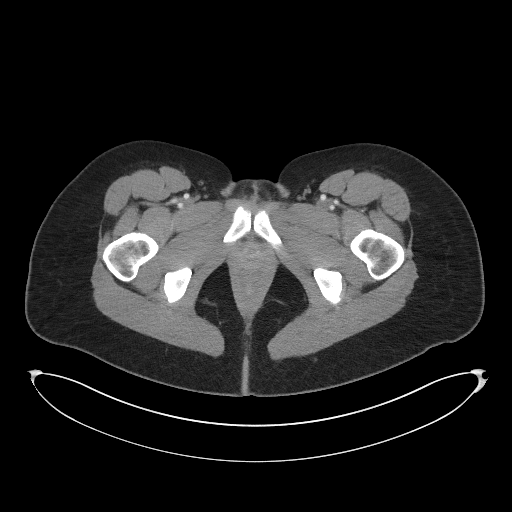
[im 17/89  soft-tissue]
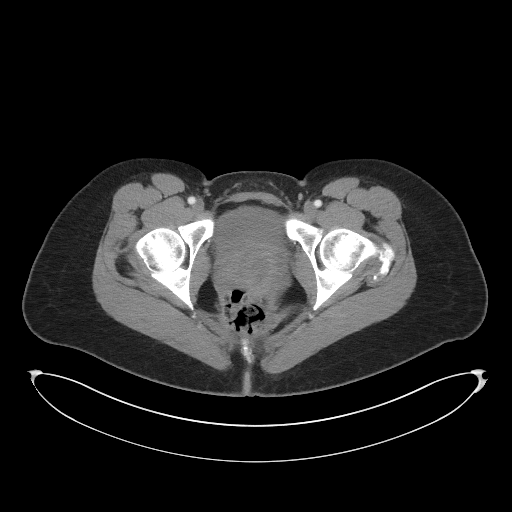
[im 24/89  soft-tissue]
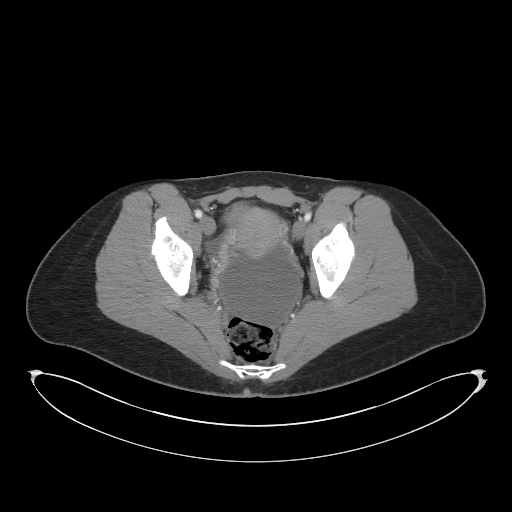
[im 31/89  soft-tissue]
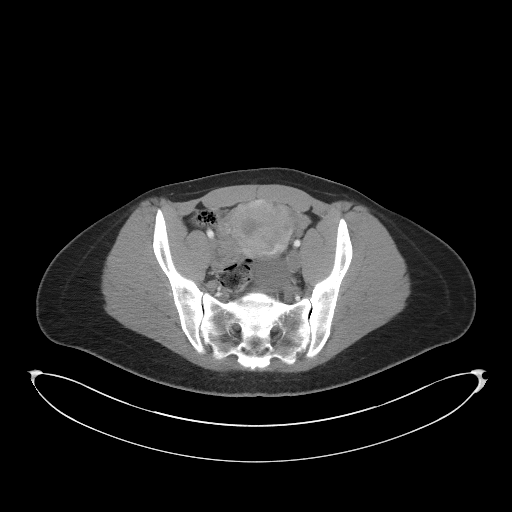
[im 38/89  soft-tissue]
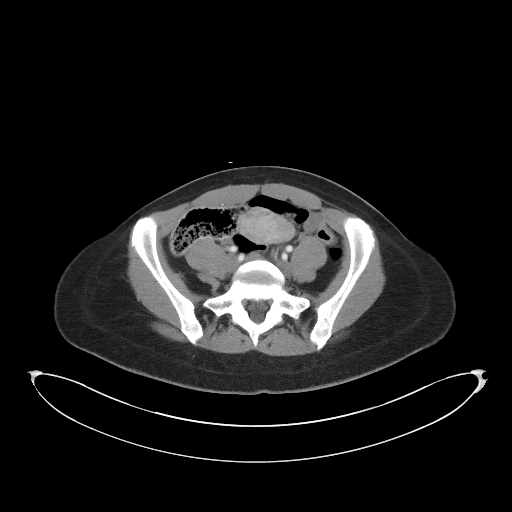
[im 45/89  soft-tissue]
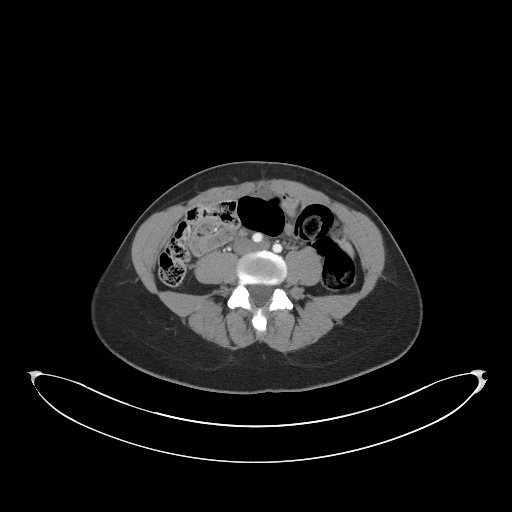
[im 51/89  soft-tissue]
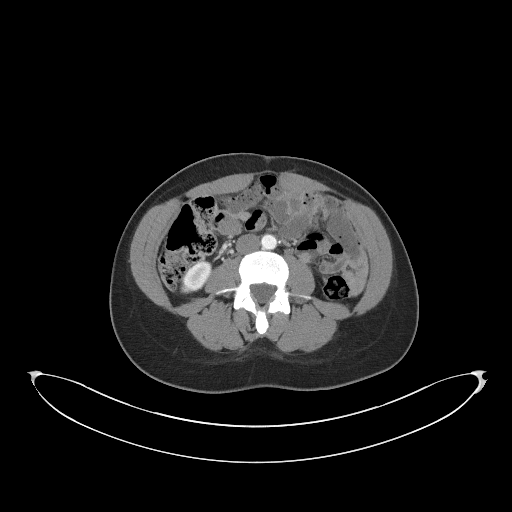
[im 58/89  soft-tissue]
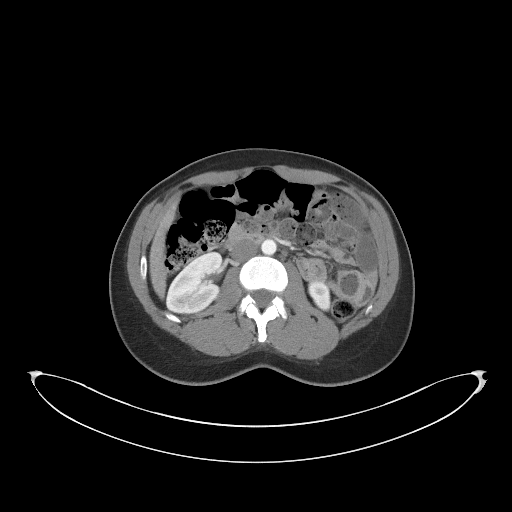
[im 58/89  bone]
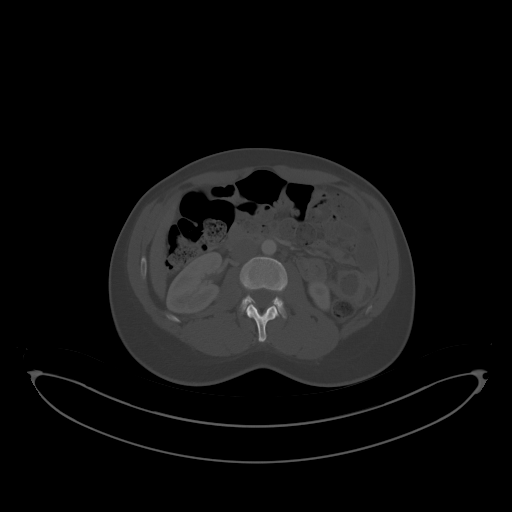
[im 65/89  soft-tissue]
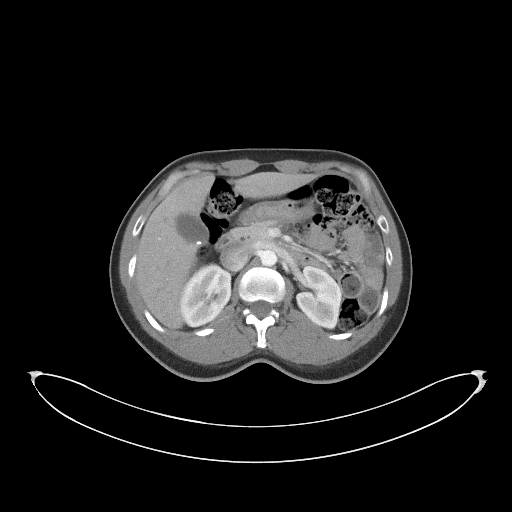
[im 72/89  soft-tissue]
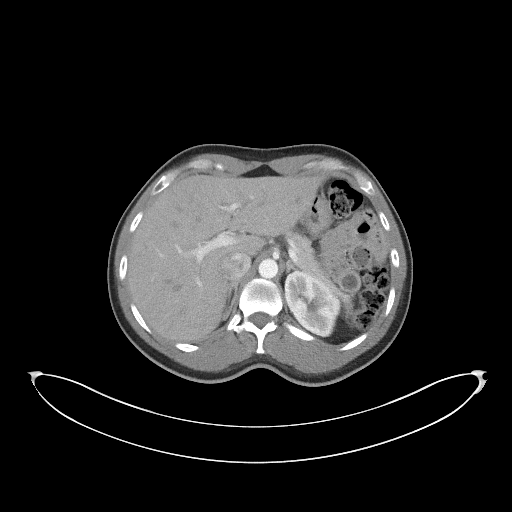
[im 78/89  soft-tissue]
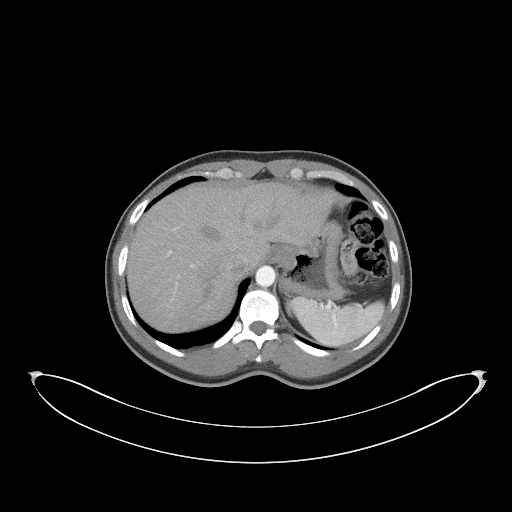
[im 85/89  soft-tissue]
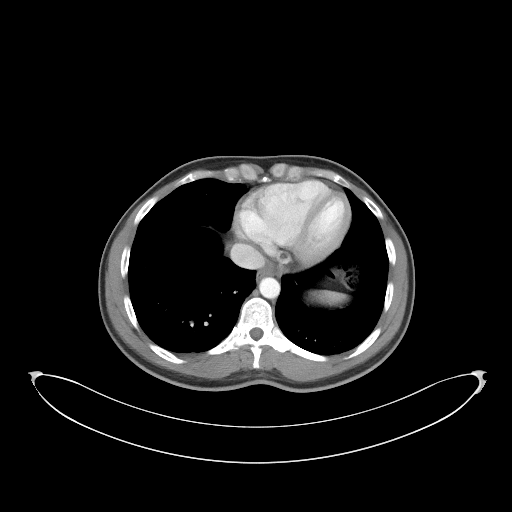

[Series 5: coronal st · coronal · 0.83mm/px · 3 of 82 slices shown]
[im 28/82  soft-tissue]
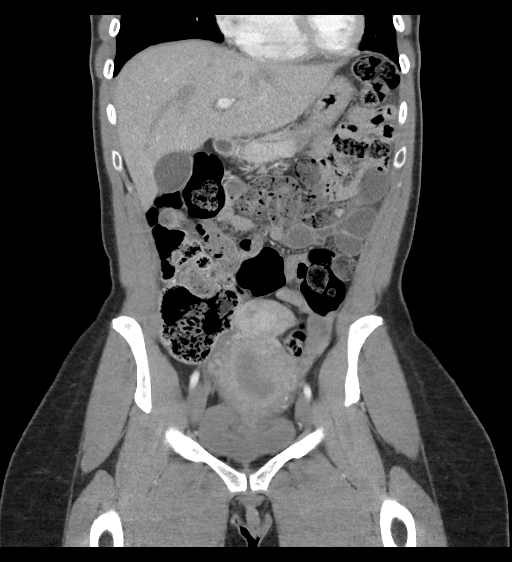
[im 37/82  soft-tissue]
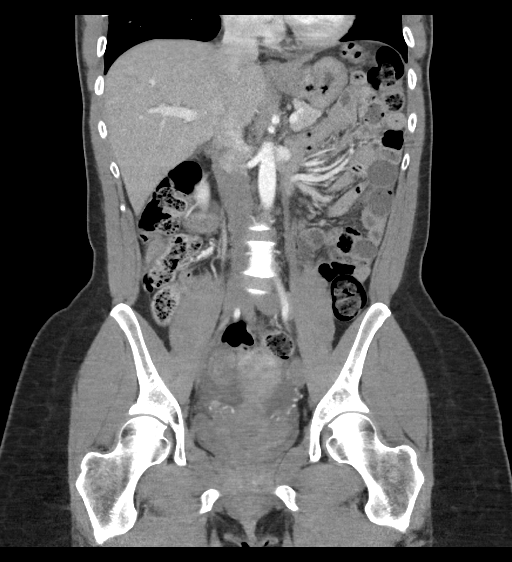
[im 46/82  soft-tissue]
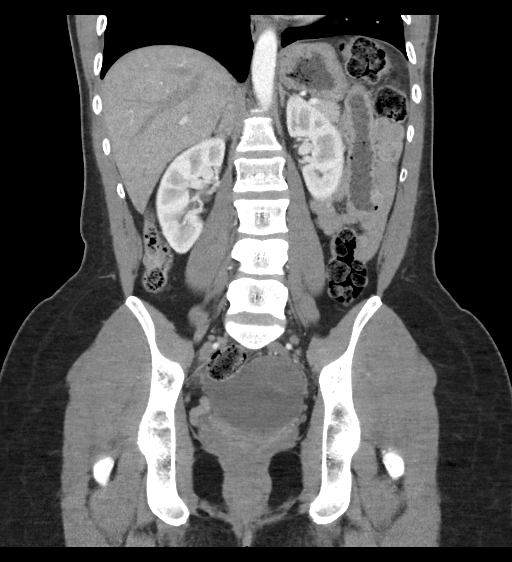

[16 of 46 positions shown; findings below may reference images not displayed]

FINDINGS: Lower chest: No acute abnormality.

Hepatobiliary: No solid liver abnormality is seen. Small gallstone
in the gallbladder. No gallbladder wall thickening, or biliary
dilatation.

Pancreas: Unremarkable. No pancreatic ductal dilatation or
surrounding inflammatory changes.

Spleen: Normal in size without significant abnormality.

Adrenals/Urinary Tract: Adrenal glands are unremarkable. Kidneys are
normal, without renal calculi, solid lesion, or hydronephrosis.
Thickening of the urinary bladder.

Stomach/Bowel: Stomach is within normal limits. Appendix appears
normal (series 2, image 48). No evidence of bowel wall thickening,
distention, or inflammatory changes.

Vascular/Lymphatic: No significant vascular findings are present. No
enlarged abdominal or pelvic lymph nodes.

Reproductive: Fluid in the endometrial cavity. Multiple uterine
fibroids, including an exophytic or pedunculated fibroid of the
fundus measuring 3.3 cm. There is a cystic lesion arising from the
left ovary or adnexa measuring 8.1 x 8.0 x 7.2 cm (series 2, image
65).

Other: No abdominal wall hernia or abnormality. No abdominopelvic
ascites.

Musculoskeletal: No acute or significant osseous findings.
IMPRESSION: 1. There is a cystic lesion which appears to arise from the left
ovary or adnexa measuring 8.1 cm. This is most likely a large
ovarian cyst, and although almost certainly benign and functional in
nature given patient age, may be symptomatic due to size or serve as
a nidus of ovarian torsion if acutely referable symptoms are
present. Recommend pelvic ultrasound to further evaluate.
2. Thickening of the urinary bladder, suggestive of nonspecific
infectious or inflammatory cystitis. Correlate with urinalysis.
3. Multiple uterine fibroids.
4. Cholelithiasis without evidence of acute cholecystitis.
5. Normal appendix.

## 2021-07-28 ENCOUNTER — Emergency Department (HOSPITAL_BASED_OUTPATIENT_CLINIC_OR_DEPARTMENT_OTHER)
Admission: EM | Admit: 2021-07-28 | Discharge: 2021-07-28 | Disposition: A | Discharge status: Home / Self Care | Payer: Medicaid Other | Attending: Emergency Medicine | Admitting: Emergency Medicine

## 2021-07-28 ENCOUNTER — Emergency Department (HOSPITAL_BASED_OUTPATIENT_CLINIC_OR_DEPARTMENT_OTHER)
Admission: EM | Admit: 2021-07-28 | Discharge: 2021-07-28 | Disposition: A | Discharge status: Home / Self Care | Payer: Medicaid Other | Source: Home / Self Care | Attending: Emergency Medicine | Admitting: Emergency Medicine

## 2021-07-28 ENCOUNTER — Encounter (HOSPITAL_BASED_OUTPATIENT_CLINIC_OR_DEPARTMENT_OTHER): Payer: Self-pay

## 2021-07-28 ENCOUNTER — Other Ambulatory Visit: Payer: Self-pay

## 2021-07-28 DIAGNOSIS — M545 Low back pain, unspecified: Secondary | ICD-10-CM | POA: Insufficient documentation

## 2021-07-28 DIAGNOSIS — Z5321 Procedure and treatment not carried out due to patient leaving prior to being seen by health care provider: Secondary | ICD-10-CM | POA: Insufficient documentation

## 2021-07-28 LAB — PREGNANCY, URINE: Preg Test, Ur: NEGATIVE

## 2021-07-28 MED ORDER — METHOCARBAMOL 500 MG PO TABS: 500.0000 mg | ORAL_TABLET | Freq: Two times a day (BID) | ORAL | 0 refills | Status: DC

## 2021-07-28 MED ORDER — LIDOCAINE 5 % EX PTCH: 1.0000 | MEDICATED_PATCH | CUTANEOUS | 0 refills | Status: DC

## 2021-07-28 NOTE — ED Triage Notes (Signed)
Pt c/o lower back pain that radiates down left LE x 4 days-denies injury-denies hx of same-NAD-to triage in w/c

## 2021-07-28 NOTE — ED Provider Notes (Cosign Needed)
Patient left without being seen  No diagnosis found.    Silva Bandy, PA-C 07/28/21 1241

## 2021-07-28 NOTE — Discharge Instructions (Addendum)
As we discussed, I suspect that you back pain is due to prolonged periods of sitting in the same position. I have prescribed a muscle relaxer and a lidocaine patch for management of your pain.  Please do not drive or operate heavy machinery after taking your muscle relaxer as it can be sedating.  I also recommend Tylenol and ibuprofen for management of your pain as this is shown to be the most successful in giving you relief.  I have attached some exercises for you to do that can also relieve your pain to your discharge paperwork.  Please do these as you are able.  Return if development of any new or worsening symptoms.

## 2021-07-28 NOTE — ED Provider Notes (Signed)
[facility] EMERGENCY DEPARTMENT Provider Note   CSN: [id number] Arrival date & time: 07/28/21  1459     History  Chief Complaint  Patient presents with   Back Pain    Gabrielle Zimmerman is a 41 y.o. female.  Patient with no pertinent past medical history presents today with chief complaint of back pain.  She states that same began on Monday after she sat in a chair all day. She denies any injury. States that pain is triggered by bending and that the pain is sharp in nature and localized to her lumbar spine and surrounding area. Pain does not radiate down her legs. No numbness/tingling in her extremities, no loss of bowel or bladder function. She has not tried anything for her symptoms. She does not smoke, drink alcohol, and denies IVDU or other recreational drug use.   The history is provided by the patient. No language interpreter was used.  Back Pain Associated symptoms: no fever       Home Medications Prior to Admission medications   Medication Sig Start Date End Date Taking? Authorizing Provider  lidocaine (LIDODERM) 5 % Place 1 patch onto the skin daily. Remove & Discard patch within 12 hours or as directed by MD 07/28/21  Yes Jelene Albano, Shawn Route, PA-C  methocarbamol (ROBAXIN) 500 MG tablet Take 1 tablet (500 mg total) by mouth 2 (two) times daily. 07/28/21  Yes Isobelle Tuckett, Shawn Route, PA-C  etonogestrel-ethinyl estradiol (NUVARING) 0.12-0.015 MG/24HR vaginal ring Place 1 each vaginally every 28 (twenty-eight) days. Insert vaginally and leave in place for 3 consecutive weeks, then remove for 1 week.    [provider]  etonogestrel-ethinyl estradiol (NUVARING) 0.12-0.015 MG/24HR vaginal ring Insert vaginally and leave in place for 3 consecutive weeks, then remove for 1 week. 08/31/20   Aviva Signs, CNM  naproxen (NAPROSYN) 500 MG tablet Take 1 tablet (500 mg total) by mouth 2 (two) times daily. 10/05/20   Vanetta Mulders, MD      Allergies    Pork (porcine) protein and  Pork-derived products    Review of Systems   Review of Systems  Constitutional:  Negative for chills and fever.  Gastrointestinal:  Negative for diarrhea, nausea and vomiting.  Musculoskeletal:  Positive for back pain.  All other systems reviewed and are negative.  Physical Exam Updated Vital Signs BP (!) 139/91 (BP Location: Left Arm)    Pulse 83    Temp 97.6 F (36.4 C) (Oral)    Resp 18    Ht 5\' 6"  (1.676 m)    Wt 81.6 kg    LMP  (LMP Unknown)    SpO2 100%    BMI 29.05 kg/m  Physical Exam Vitals and nursing note reviewed.  Constitutional:      General: She is not in acute distress.    Appearance: Normal appearance. She is normal weight. She is not ill-appearing, toxic-appearing or diaphoretic.     Comments: Patient resting comfortably in bed in no acute distress  HENT:     Head: Normocephalic and atraumatic.  Eyes:     Extraocular Movements: Extraocular movements intact.     Pupils: Pupils are equal, round, and reactive to light.  Cardiovascular:     Rate and Rhythm: Normal rate and regular rhythm.     Heart sounds: Normal heart sounds.  Pulmonary:     Effort: Pulmonary effort is normal. No respiratory distress.     Breath sounds: Normal breath sounds.  Abdominal:     General:  Abdomen is flat.     Palpations: Abdomen is soft.  Musculoskeletal:     Cervical back: Normal range of motion.     Comments: Patient with bilateral paraspinous muscle tightness and tenderness noted along the lumbar spine. No midline tenderness. Negative straight leg raise. Patient ambulatory with normal gait. Able to bend her low back with full ROM with some pain.  Skin:    General: Skin is warm and dry.  Neurological:     General: No focal deficit present.     Mental Status: She is alert.  Psychiatric:        Mood and Affect: Mood normal.        Behavior: Behavior normal.    ED Results / Procedures / Treatments   Labs (all labs ordered are listed, but only abnormal results are  displayed) Labs Reviewed  PREGNANCY, URINE    EKG None  Radiology No results found.  Procedures Procedures    Medications Ordered in ED Medications - No data to display  ED Course/ Medical Decision Making/ A&P                           Medical Decision Making Amount and/or Complexity of Data Reviewed Labs: ordered.  Risk Prescription drug management.   Patient with back pain since sitting all day on Monday. No specific injury noted.  No neurological deficits and normal neuro exam.  Patient can walk with minimal pain with full ROM to lumbar spine with some pain.  No loss of bowel or bladder control.  No concern for cauda equina or epidural abscess.  No fever, night sweats, weight loss, h/o cancer, IVDU.  No imaging indicated at this time.  RICE protocol and pain medicine indicated and discussed with patient.  Also included stretching exercises.  Patient understanding and amenable with plan, educated on red flag symptoms of prompt immediate return.  Discharged in stable condition.   Final Clinical Impression(s) / ED Diagnoses Final diagnoses:  Acute midline low back pain without sciatica    Rx / DC Orders ED Discharge Orders          Ordered    methocarbamol (ROBAXIN) 500 MG tablet  2 times daily        07/28/21 1546    lidocaine (LIDODERM) 5 %  Every 24 hours        07/28/21 1546          An After Visit Summary was printed and given to the patient.     Silva Bandy, PA-C 07/28/21 1947    Franne Forts, DO 07/31/21 2116

## 2021-07-28 NOTE — ED Notes (Signed)
Has started back to school that has required sitting in front of a computer most of the day.  Her pain started Monday in the lumbar area radiates up towards the thoracic spine.  Has been getting progressively worse causing her difficulty changing from sitting to standing positions.   Has tried putting ice and heat with no improvement--just minimal temporary relief.

## 2021-07-28 NOTE — ED Triage Notes (Signed)
Pt c/o lower back pain radiating down left LE-left AMA earlier due to she had to be at school-NAD-slow gait

## 2021-07-28 NOTE — ED Notes (Signed)
Pt stated intent to leave AMA. Pt unable to be talked into staying. PA Donetta Potts Notified and pt signed AMA form.

## 2021-08-01 IMAGING — CR DG ABDOMEN ACUTE W/ 1V CHEST
2 series · 2 of 2 positions shown · non-contrast
Comparison: 04/29/2020

CLINICAL DATA: Opioid related constipation with abdominal pain

EXAM:
DG ABDOMEN ACUTE WITH 1 VIEW CHEST

[t abdomen supine]
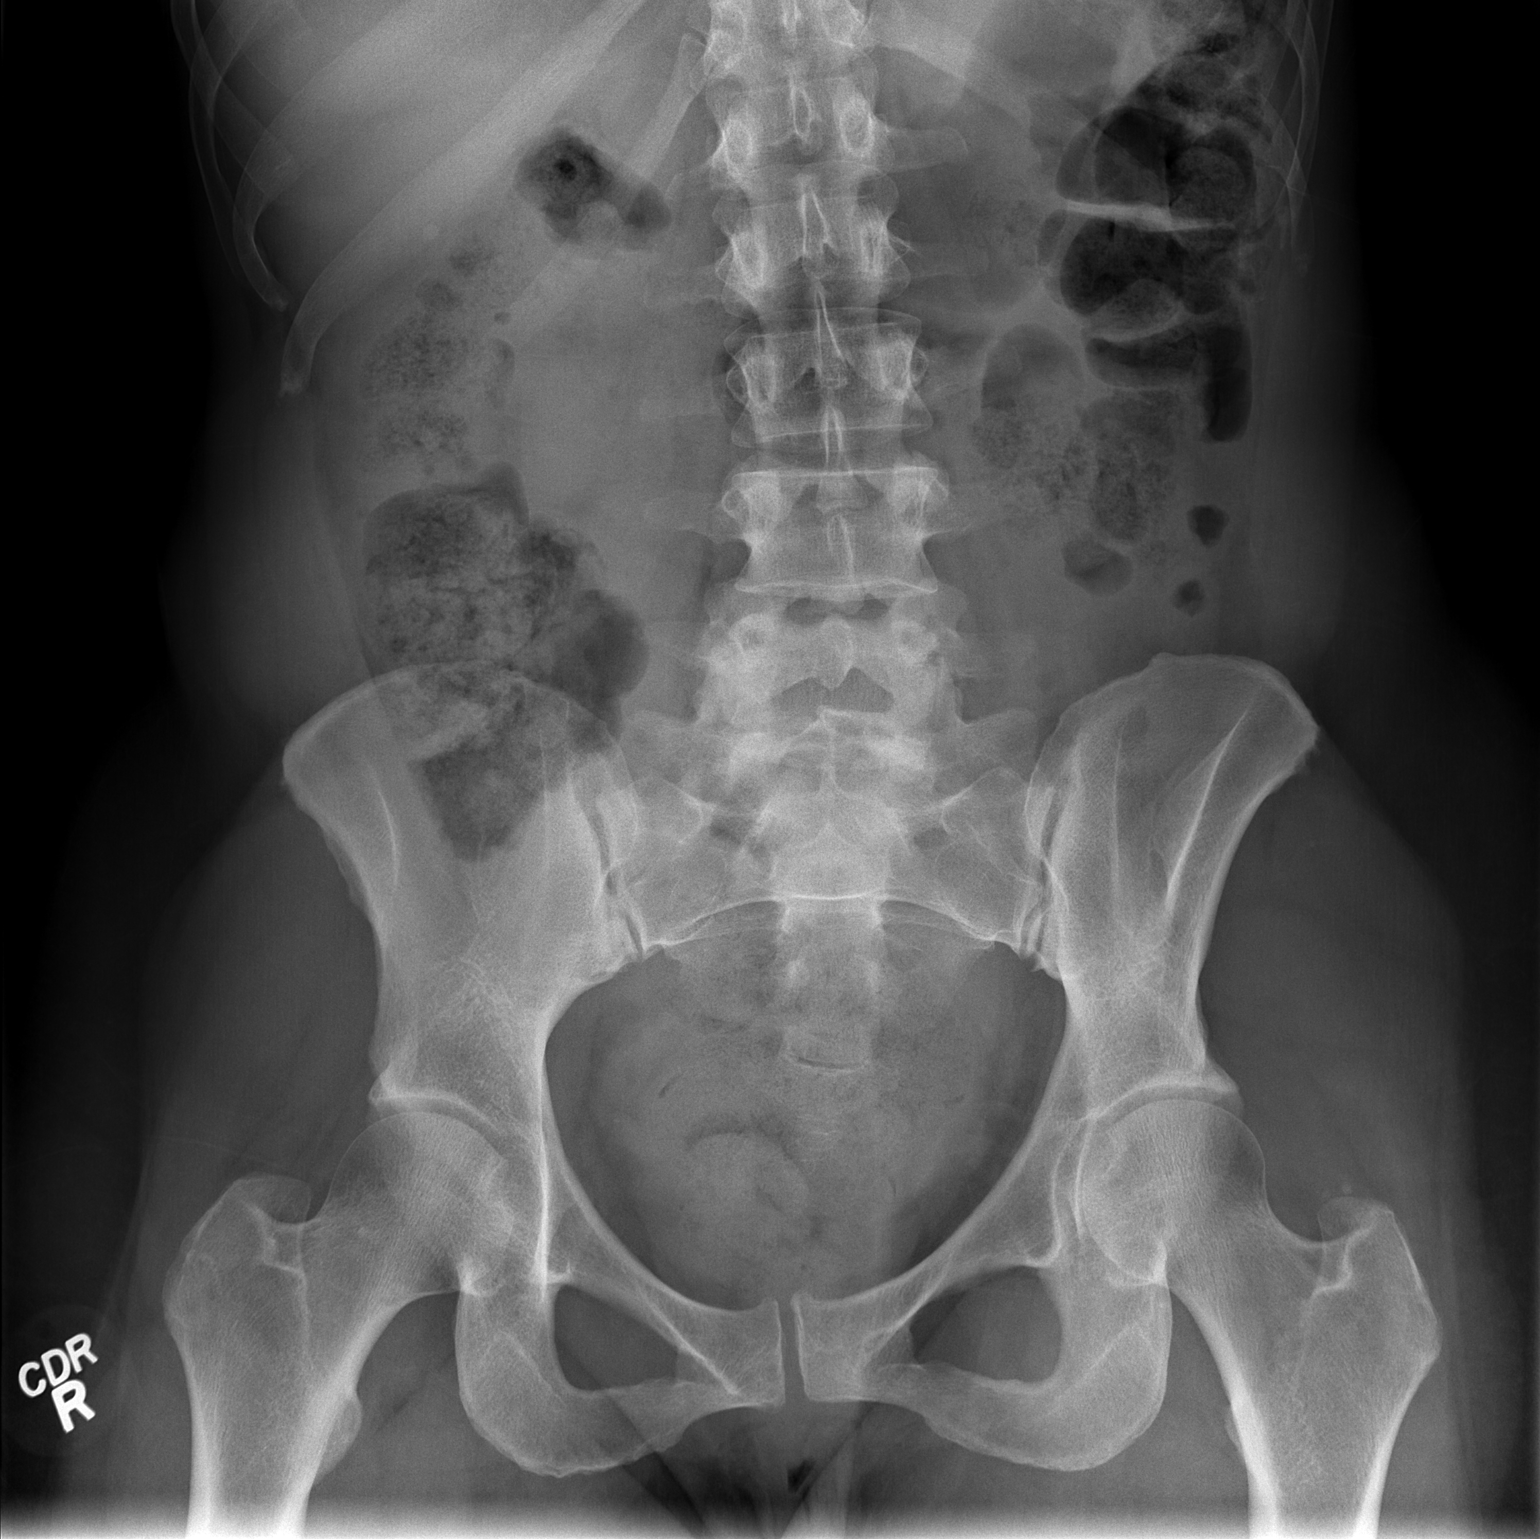

[w abdomen upright]
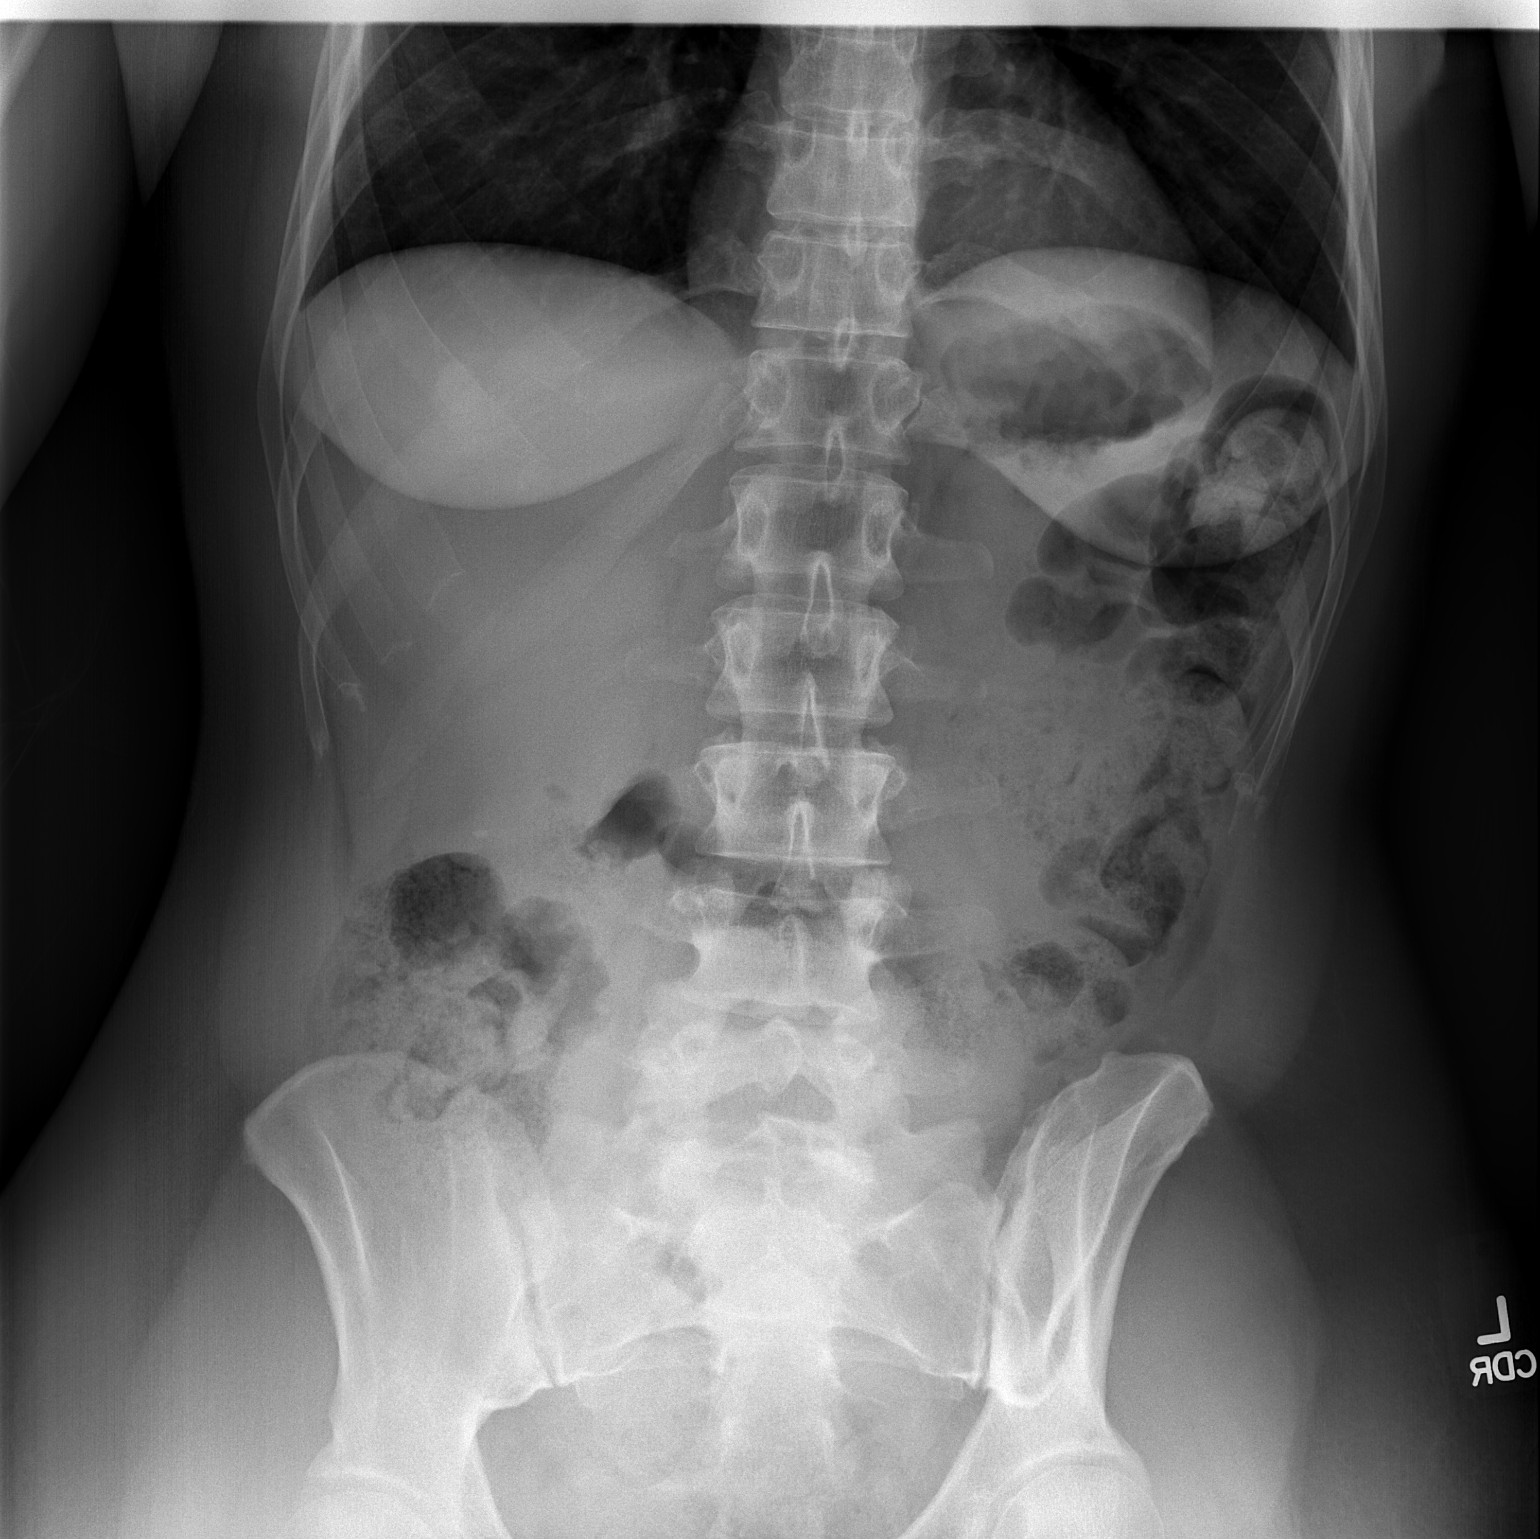

[2 of 2 positions shown; findings below may reference images not displayed]

FINDINGS: Cardiac shadow is within normal limits. Bilateral nipple shadows are
seen. The lungs are well aerated bilaterally. No focal infiltrate is
seen. No bony abnormality is noted.

Scattered large and small bowel gas is noted. Mild retained fecal
material is noted primarily within the rectum consistent with a
degree of constipation. No free air is seen. No obstructive changes
are noted. No bony abnormality is seen.
IMPRESSION: Mild constipation primarily in the rectum.

## 2021-10-03 ENCOUNTER — Telehealth: Payer: Self-pay | Admitting: General Practice

## 2021-11-08 ENCOUNTER — Other Ambulatory Visit (HOSPITAL_BASED_OUTPATIENT_CLINIC_OR_DEPARTMENT_OTHER): Payer: Self-pay

## 2021-11-08 ENCOUNTER — Other Ambulatory Visit: Payer: Self-pay

## 2021-11-08 ENCOUNTER — Telehealth: Payer: Self-pay | Admitting: General Practice

## 2021-11-08 MED ORDER — ETONOGESTREL-ETHINYL ESTRADIOL 0.12-0.015 MG/24HR VA RING
VAGINAL_RING | VAGINAL | 6 refills | Status: DC
  Filled 2021-11-08: qty 1, 28d supply, fill #0

## 2021-11-08 NOTE — Telephone Encounter (Signed)
Patient called and left message on nurse voicemail line stating she needs a refill on her nuvaring because she doesn't have enough to last until her appt.  ?

## 2021-12-16 ENCOUNTER — Encounter: Payer: Self-pay | Admitting: Family Medicine

## 2021-12-16 ENCOUNTER — Ambulatory Visit: Payer: Medicaid Other | Admitting: Family Medicine

## 2021-12-16 ENCOUNTER — Other Ambulatory Visit (HOSPITAL_COMMUNITY)
Admission: RE | Admit: 2021-12-16 | Discharge: 2021-12-16 | Disposition: A | Discharge status: Home / Self Care | Payer: Medicaid Other | Source: Ambulatory Visit | Attending: Family Medicine | Admitting: Family Medicine

## 2021-12-16 VITALS — BP 126/88 | HR 73 | Ht 66.0 in | Wt 188.0 lb

## 2021-12-16 DIAGNOSIS — Z01419 Encounter for gynecological examination (general) (routine) without abnormal findings: Secondary | ICD-10-CM | POA: Insufficient documentation

## 2021-12-16 DIAGNOSIS — N946 Dysmenorrhea, unspecified: Secondary | ICD-10-CM | POA: Diagnosis not present

## 2021-12-16 MED ORDER — ETONOGESTREL-ETHINYL ESTRADIOL 0.12-0.015 MG/24HR VA RING: 1.0000 | VAGINAL_RING | VAGINAL | 3 refills | Status: DC

## 2021-12-16 NOTE — Addendum Note (Signed)
Addended by: Levie Heritage on: 12/16/2021 04:26 PM   Modules accepted: Orders

## 2021-12-16 NOTE — Progress Notes (Signed)
GYNECOLOGY ANNUAL PREVENTATIVE CARE ENCOUNTER NOTE  Subjective:   Gabrielle Zimmerman is a 41 y.o. [phone] female here for a routine annual gynecologic exam.  Current complaints: The patient's period has become lighter over the past several months.  Her periods are normally moderate with fair amount of cramping.  She has continued to get fairly significant cramping.  She does exchange the NuvaRing a little later than the 21 days.  Denies abnormal vaginal bleeding, discharge, pelvic pain, problems with intercourse or other gynecologic concerns.    Gynecologic History Patient's last menstrual period was 12/06/2021 (exact date). Contraception: NuvaRing vaginal inserts Last Pap: 2022. Results were: normal Last mammogram: n/a.   The pregnancy intention screening data noted above was reviewed. Potential methods of contraception were discussed. The patient elected to proceed with No data recorded.   Obstetric History OB History  Gravida Para Term Preterm AB Living  6 3 3   3     SAB IAB Ectopic Multiple Live Births    3          # Outcome Date GA Lbr Len/2nd Weight Sex Delivery Anes PTL Lv  6 IAB 09/16/19             Birth Comments: medical abortion  5 Term 12/01/08 [redacted]w[redacted]d    Vag-Vacuum        Birth Comments: fetal bradycardia  4 Term      Vag-Spont     3 Term      Vag-Spont     2 IAB           1 IAB             Past Medical History:  Diagnosis Date   Anemia    Asthma    Ovarian cyst     Past Surgical History:  Procedure Laterality Date   LEEP      Current Outpatient Medications on File Prior to Visit  Medication Sig Dispense Refill   lidocaine (LIDODERM) 5 % Place 1 patch onto the skin daily. Remove & Discard patch within 12 hours or as directed by MD (Patient not taking: Reported on 12/16/2021) 30 patch 0   methocarbamol (ROBAXIN) 500 MG tablet Take 1 tablet (500 mg total) by mouth 2 (two) times daily. (Patient not taking: Reported on 12/16/2021) 20 tablet 0   naproxen  (NAPROSYN) 500 MG tablet Take 1 tablet (500 mg total) by mouth 2 (two) times daily. (Patient not taking: Reported on 12/16/2021) 14 tablet 0   No current facility-administered medications on file prior to visit.    Allergies  Allergen Reactions   Pork (Porcine) Protein Nausea And Vomiting   Pork-Derived Products Nausea And Vomiting, Diarrhea and Other (See Comments)    Stomach cramps/gas     Social History   Socioeconomic History   Marital status: Divorced    Spouse name: Not on file   Number of children: 3   Years of education: Bachelor's degree   Highest education level: Bachelor's degree (e.g., BA, AB, BS)  Occupational History   Occupation: Nutritionist and Physiological scientist  Tobacco Use   Smoking status: Never   Smokeless tobacco: Never  Vaping Use   Vaping Use: Never used  Substance and Sexual Activity   Alcohol use: Yes    Comment: weekends   Drug use: Never   Sexual activity: Yes    Birth control/protection: Inserts  Other Topics Concern   Not on file  Social History Narrative   Not on file  Social Determinants of Health   Financial Resource Strain: Medium Risk (06/09/2020)   Overall Financial Resource Strain (CARDIA)    Difficulty of Paying Living Expenses: Somewhat hard  Food Insecurity: No Food Insecurity (06/09/2020)   Hunger Vital Sign    Worried About Running Out of Food in the Last Year: Never true    Ran Out of Food in the Last Year: Never true  Transportation Needs: No Transportation Needs (06/10/2020)   PRAPARE - Administrator, Civil Service (Medical): No    Lack of Transportation (Non-Medical): No  Physical Activity: Sufficiently Active (06/10/2020)   Exercise Vital Sign    Days of Exercise per Week: 5 days    Minutes of Exercise per Session: 50 min  Stress: No Stress Concern Present (06/10/2020)   [employer] of Occupational Health - Occupational Stress Questionnaire    Feeling of Stress : Not at all  Social Connections:  Socially Isolated (06/10/2020)   Social Connection and Isolation Panel [NHANES]    Frequency of Communication with Friends and Family: More than three times a week    Frequency of Social Gatherings with Friends and Family: More than three times a week    Attends Religious Services: Never    Database administrator or Organizations: No    Attends Banker Meetings: Never    Marital Status: Divorced  Catering manager Violence: Not At Risk (06/10/2020)   Humiliation, Afraid, Rape, and Kick questionnaire    Fear of Current or Ex-Partner: No    Emotionally Abused: No    Physically Abused: No    Sexually Abused: No    Family History  Problem Relation Age of Onset   Healthy Mother    Diabetes Father     The following portions of the patient's history were reviewed and updated as appropriate: allergies, current medications, past family history, past medical history, past social history, past surgical history and problem list.  Review of Systems Pertinent items are noted in HPI.   Objective:  BP 126/88   Pulse 73   Ht 5\' 6"  (1.676 m)   Wt 188 lb (85.3 kg)   LMP 12/06/2021 (Exact Date)   BMI 30.34 kg/m  Wt Readings from Last 3 Encounters:  12/16/21 188 lb (85.3 kg)  07/28/21 180 lb (81.6 kg)  07/28/21 165 lb (74.8 kg)     Chaperone present during exam  CONSTITUTIONAL: Well-developed, well-nourished female in no acute distress.  HENT:  Normocephalic, atraumatic, External right and left ear normal. Oropharynx is clear and moist EYES: Conjunctivae and EOM are normal. Pupils are equal, round, and reactive to light. No scleral icterus.  NECK: Normal range of motion, supple, no masses.  Normal thyroid.   CARDIOVASCULAR: Normal heart rate noted, regular rhythm RESPIRATORY: Clear to auscultation bilaterally. Effort and breath sounds normal, no problems with respiration noted. BREASTS: Symmetric in size. No masses, skin changes, nipple drainage, or lymphadenopathy. ABDOMEN:  Soft, normal bowel sounds, no distention noted.  No tenderness, rebound or guarding.  PELVIC: Normal appearing external genitalia; normal appearing vaginal mucosa and cervix.  No abnormal discharge noted.  Normal uterine size, no other palpable masses, no uterine or adnexal tenderness. MUSCULOSKELETAL: Normal range of motion. No tenderness.  No cyanosis, clubbing, or edema.  2+ distal pulses. SKIN: Skin is warm and dry. No rash noted. Not diaphoretic. No erythema. No pallor. NEUROLOGIC: Alert and oriented to person, place, and time. Normal reflexes, muscle tone coordination. No cranial nerve deficit noted. PSYCHIATRIC: Normal  mood and affect. Normal behavior. Normal judgment and thought content.  Assessment:  Annual gynecologic examination with pap smear   Plan:  1. Well Woman Exam Will follow up results of pap smear and manage accordingly. STD testing discussed. Patient requested testing - Cytology - PAP( [facility])  2. Dysmenorrhea We will do menstrual suppression with NuvaRing every 3 weeks.  Patient will follow-up if not effective.   Routine preventative health maintenance measures emphasized. Please refer to After Visit Summary for other counseling recommendations.    Candelaria Celeste, DO [facility]

## 2021-12-20 LAB — CYTOLOGY - PAP
Adequacy: ABSENT
Comment: NEGATIVE
Diagnosis: NEGATIVE
High risk HPV: NEGATIVE

## 2021-12-21 ENCOUNTER — Encounter: Payer: Medicaid Other | Admitting: Family Medicine

## 2022-06-07 ENCOUNTER — Emergency Department (HOSPITAL_BASED_OUTPATIENT_CLINIC_OR_DEPARTMENT_OTHER)
Admission: EM | Admit: 2022-06-07 | Discharge: 2022-06-07 | Disposition: A | Discharge status: Home / Self Care | Payer: Medicaid Other | Attending: Emergency Medicine | Admitting: Emergency Medicine

## 2022-06-07 ENCOUNTER — Encounter (HOSPITAL_BASED_OUTPATIENT_CLINIC_OR_DEPARTMENT_OTHER): Payer: Self-pay

## 2022-06-07 DIAGNOSIS — U071 COVID-19: Secondary | ICD-10-CM | POA: Insufficient documentation

## 2022-06-07 DIAGNOSIS — J45909 Unspecified asthma, uncomplicated: Secondary | ICD-10-CM | POA: Insufficient documentation

## 2022-06-07 DIAGNOSIS — J029 Acute pharyngitis, unspecified: Secondary | ICD-10-CM | POA: Diagnosis present

## 2022-06-07 LAB — RESP PANEL BY RT-PCR (FLU A&B, COVID) ARPGX2
Influenza A by PCR: NEGATIVE
Influenza B by PCR: NEGATIVE
SARS Coronavirus 2 by RT PCR: POSITIVE — AB

## 2022-06-07 NOTE — ED Triage Notes (Signed)
C/o sore throat, fatigue, body aches, facial pain, cough since Monday night.

## 2022-06-07 NOTE — ED Notes (Signed)
Reviewed discharge instructions and recommendations with pt. Work noted provided. Information sheet regarding covid provided for pt. States understanding

## 2022-06-07 NOTE — ED Provider Notes (Signed)
[facility] EMERGENCY DEPARTMENT Provider Note   CSN: [id number] Arrival date & time: 06/07/22  0753     History  Chief Complaint  Patient presents with   URI    Gabrielle Zimmerman is a 41 y.o. female.  Patient has 2 sons with similar symptoms.  But she was actually out of town in [place].  She got sick on her trip back started to feel not right on Monday.  The other 2 boys symptoms started on Sunday and they did not travel with her.  Complaint Gabrielle Zimmerman fatigue body aches sore throat and a cough no shortness of breath.  Symptoms been present since Monday night.  Patient had COVID in the past.  [name] it was very mild when she had it before.  Past medical history significant for asthma and anemia.  Patient's never used tobacco products.       Home Medications Prior to Admission medications   Medication Sig Start Date End Date Taking? Authorizing Provider  etonogestrel-ethinyl estradiol (NUVARING) 0.12-0.015 MG/24HR vaginal ring Place 1 each vaginally every 21 ( twenty-one) days. Insert vaginally and leave in place for 3 consecutive weeks, then replace for cycle suppression. 12/16/21   Levie Heritage, DO      Allergies    Pork (porcine) protein and Pork-derived products    Review of Systems   Review of Systems  Constitutional:  Positive for fatigue. Negative for chills and fever.  HENT:  Positive for sore throat. Negative for ear pain.   Eyes:  Negative for pain and visual disturbance.  Respiratory:  Positive for cough. Negative for shortness of breath.   Cardiovascular:  Negative for chest pain and palpitations.  Gastrointestinal:  Negative for abdominal pain and vomiting.  Genitourinary:  Negative for dysuria and hematuria.  Musculoskeletal:  Positive for myalgias. Negative for arthralgias and back pain.  Skin:  Negative for color change and rash.  Neurological:  Negative for seizures and syncope.  All other systems reviewed and are negative.   Physical  Exam Updated Vital Signs BP 123/86 (BP Location: Left Arm)   Pulse 85   Temp 98.3 F (36.8 C) (Oral)   Resp 18   SpO2 96%  Physical Exam Vitals and nursing note reviewed.  Constitutional:      General: She is not in acute distress.    Appearance: She is well-developed.  HENT:     Head: Normocephalic and atraumatic.     Mouth/Throat:     Mouth: Mucous membranes are moist.     Pharynx: Posterior oropharyngeal erythema present. No oropharyngeal exudate.     Comments: Uvula midline Eyes:     Conjunctiva/sclera: Conjunctivae normal.  Cardiovascular:     Rate and Rhythm: Normal rate and regular rhythm.     Heart sounds: No murmur heard. Pulmonary:     Effort: Pulmonary effort is normal. No respiratory distress.     Breath sounds: Normal breath sounds. No wheezing or rales.  Abdominal:     Palpations: Abdomen is soft.     Tenderness: There is no abdominal tenderness.  Musculoskeletal:        General: No swelling.     Cervical back: Normal range of motion and neck supple. No rigidity.  Skin:    General: Skin is warm and dry.     Capillary Refill: Capillary refill takes less than 2 seconds.  Neurological:     General: No focal deficit present.     Mental Status: She is alert and oriented  to person, place, and time.     Cranial Nerves: No cranial nerve deficit.     Sensory: No sensory deficit.     Motor: No weakness.  Psychiatric:        Mood and Affect: Mood normal.     ED Results / Procedures / Treatments   Labs (all labs ordered are listed, but only abnormal results are displayed) Labs Reviewed  RESP PANEL BY RT-PCR (FLU A&B, COVID) ARPGX2 - Abnormal; Notable for the following components:      Result Value   SARS Coronavirus 2 by RT PCR POSITIVE (*)    All other components within normal limits    EKG None  Radiology No results found.  Procedures Procedures    Medications Ordered in ED Medications - No data to display  ED Course/ Medical Decision Making/  A&P                           Medical Decision Making  Patient nontoxic oxygen saturations are 96%.  Respiratory rate 18 temp 98.3.  Patient's lungs are very clear.  COVID test is positive.  She has 2 sons that have similar symptoms but she was out of town so no connection.  The sons were here with her today.  1 tested positive for RSV suspect they both have RSV.  Both tested negative for COVID.  Symptomatic treatment precautions provided work note provided quarantine recommendations provided.   Final Clinical Impression(s) / ED Diagnoses Final diagnoses:  COVID    Rx / DC Orders ED Discharge Orders     None         Vanetta Mulders, MD 06/07/22 1034

## 2022-06-07 NOTE — Discharge Instructions (Signed)
Testing today positive for COVID.  Oxygen saturations are very good at 96%.  Return for any trouble breathing or any new or worse symptoms.  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  Otherwise over-the-counter cold and flu medicines appropriate.

## 2022-07-24 ENCOUNTER — Other Ambulatory Visit: Payer: Self-pay

## 2022-07-24 ENCOUNTER — Encounter (HOSPITAL_BASED_OUTPATIENT_CLINIC_OR_DEPARTMENT_OTHER): Payer: Self-pay | Admitting: Emergency Medicine

## 2022-07-24 ENCOUNTER — Emergency Department (HOSPITAL_BASED_OUTPATIENT_CLINIC_OR_DEPARTMENT_OTHER)
Admission: EM | Admit: 2022-07-24 | Discharge: 2022-07-24 | Disposition: A | Discharge status: Home / Self Care | Payer: Medicaid Other | Attending: Emergency Medicine | Admitting: Emergency Medicine

## 2022-07-24 DIAGNOSIS — R059 Cough, unspecified: Secondary | ICD-10-CM | POA: Diagnosis present

## 2022-07-24 DIAGNOSIS — J45909 Unspecified asthma, uncomplicated: Secondary | ICD-10-CM | POA: Diagnosis not present

## 2022-07-24 DIAGNOSIS — Z20822 Contact with and (suspected) exposure to covid-19: Secondary | ICD-10-CM | POA: Insufficient documentation

## 2022-07-24 DIAGNOSIS — J069 Acute upper respiratory infection, unspecified: Secondary | ICD-10-CM | POA: Insufficient documentation

## 2022-07-24 LAB — RESP PANEL BY RT-PCR (RSV, FLU A&B, COVID)  RVPGX2
Influenza A by PCR: NEGATIVE
Influenza B by PCR: NEGATIVE
Resp Syncytial Virus by PCR: NEGATIVE
SARS Coronavirus 2 by RT PCR: NEGATIVE

## 2022-07-24 MED ORDER — HYDROCOD POLI-CHLORPHE POLI ER 10-8 MG/5ML PO SUER
5.0000 mL | Freq: Once | ORAL | Status: AC
  Administered 2022-07-24: 5 mL via ORAL
  Filled 2022-07-24: qty 5

## 2022-07-24 MED ORDER — NAPROXEN 250 MG PO TABS
500.0000 mg | ORAL_TABLET | Freq: Once | ORAL | Status: AC
  Administered 2022-07-24: 500 mg via ORAL
  Filled 2022-07-24: qty 2

## 2022-07-24 MED ORDER — HYDROCOD POLI-CHLORPHE POLI ER 10-8 MG/5ML PO SUER: 5.0000 mL | Freq: Two times a day (BID) | ORAL | 0 refills | Status: DC | PRN

## 2022-07-24 MED ORDER — NAPROXEN 375 MG PO TABS: ORAL_TABLET | ORAL | 0 refills | Status: DC

## 2022-07-24 NOTE — ED Triage Notes (Signed)
Patient arrived via POV c/o URI x 4 days. Patient states cough, congestion, fatigue, sore throat. Patient denies [facility], N/V/D. Patient states 6/10 pain. Patient is AO x 4,VS WDL, normal gait.

## 2022-07-24 NOTE — ED Provider Notes (Signed)
[place] DEPT [facility] Provider Note: Georgena Spurling, MD, FACEP  CSN: [id number] MRN: [id number] ARRIVAL: 07/24/22 at 2053 ROOM: [place]  07/24/22 11:29 PM Gabrielle Zimmerman is a 42 y.o. female with URI symptoms for 4 days.  Specifically she has had cough sometimes productive of yellow phlegm, nasal congestion, sinus pain, fatigue and sore throat.  She denies shortness of breath.  She denies nausea, vomiting or diarrhea.  She rates the pain in her throat and sinuses as a 6 out of 10, worse with swallowing.  She has been taking over-the-counter cough and cold medications without relief.  She has not taken anything for pain such as ibuprofen or Aleve.   Past Medical History:  Diagnosis Date   Anemia    Asthma    Ovarian cyst     Past Surgical History:  Procedure Laterality Date   LEEP      Family History  Problem Relation Age of Onset   Healthy Mother    Diabetes Father     Social History   Tobacco Use   Smoking status: Never   Smokeless tobacco: Never  Vaping Use   Vaping Use: Never used  Substance Use Topics   Alcohol use: Yes    Comment: weekends   Drug use: Never    Prior to Admission medications   Medication Sig Start Date End Date Taking? Authorizing Provider  chlorpheniramine-HYDROcodone (TUSSIONEX) 10-8 MG/5ML Take 5 mLs by mouth every 12 (twelve) hours as needed for cough. 07/24/22  Yes Jackalynn Art, MD  naproxen (NAPROSYN) 375 MG tablet Take 1 tablet twice daily as needed for pain or fever. 07/24/22  Yes Sheleen Conchas, MD  etonogestrel-ethinyl estradiol (NUVARING) 0.12-0.015 MG/24HR vaginal ring Place 1 each vaginally every 21 ( twenty-one) days. Insert vaginally and leave in place for 3 consecutive weeks, then replace for cycle suppression. 12/16/21   Truett Mainland, DO    Allergies Pork (porcine) protein and Pork-derived products   REVIEW OF SYSTEMS  Negative except as noted here or in the  History of Present Illness.   PHYSICAL EXAMINATION  Initial Vital Signs Blood pressure (!) 146/95, pulse 89, temperature 99.3 F (37.4 C), temperature source Oral, resp. rate 18, height 5\' 6"  (1.676 m), weight 81.6 kg, SpO2 96 %.  Examination General: Well-developed, well-nourished female in no acute distress; appearance consistent with age of record HENT: normocephalic; atraumatic; nasal congestion; no pharyngeal erythema or exudate; tenderness to percussion of maxillary sinuses Eyes: Normal appearance Neck: supple Heart: regular rate and rhythm Lungs: clear to auscultation bilaterally Abdomen: soft; nondistended; nontender; bowel sounds present Extremities: No deformity; full range of motion Neurologic: Awake, alert and oriented; motor function intact in all extremities and symmetric; no facial droop Skin: Warm and dry Psychiatric: Flat affect   RESULTS  Summary of this visit's results, reviewed and interpreted by myself:   EKG Interpretation  Date/Time:    Ventricular Rate:    PR Interval:    QRS Duration:   QT Interval:    QTC Calculation:   R Axis:     Text Interpretation:         Laboratory Studies: Results for orders placed or performed during the hospital encounter of 07/24/22 (from the past 24 hour(s))  Resp panel by RT-PCR (RSV, Flu A&B, Covid) Nasopharyngeal Swab     Status: None   Collection Time: 07/24/22  9:13 PM   Specimen: Nasopharyngeal Swab; Nasal Swab  Result  Value Ref Range   SARS Coronavirus 2 by RT PCR NEGATIVE NEGATIVE   Influenza A by PCR NEGATIVE NEGATIVE   Influenza B by PCR NEGATIVE NEGATIVE   Resp Syncytial Virus by PCR NEGATIVE NEGATIVE   Imaging Studies: No results found.  ED COURSE and MDM  Nursing notes, initial and subsequent vitals signs, including pulse oximetry, reviewed and interpreted by myself.  Vitals:   07/24/22 2105 07/24/22 2110  BP: (!) 146/95   Pulse: 89   Resp: 18   Temp: 99.3 F (37.4 C)   TempSrc: Oral    SpO2: 96%   Weight:  81.6 kg  Height:  5\' 6"  (1.676 m)   Medications  chlorpheniramine-HYDROcodone (TUSSIONEX) 10-8 MG/5ML suspension 5 mL (has no administration in time range)  naproxen (NAPROSYN) tablet 500 mg (has no administration in time range)    The patient is negative for tested viruses but presentation is consistent with an acute viral illness.  I do not believe antibiotics would be beneficial.  She is already taking Mucinex, NyQuil and TheraFlu.  We will treat her cough with Tussionex and add naproxen as this may help with her pain.  PROCEDURES  Procedures   ED DIAGNOSES     ICD-10-CM   1. Viral URI with cough  J06.9          Ayla Dunigan, MD 07/24/22 2340

## 2022-10-16 ENCOUNTER — Encounter: Payer: Self-pay | Admitting: *Deleted

## 2022-12-20 ENCOUNTER — Encounter: Payer: Self-pay | Admitting: Obstetrics and Gynecology

## 2022-12-20 ENCOUNTER — Other Ambulatory Visit (HOSPITAL_COMMUNITY)
Admission: RE | Admit: 2022-12-20 | Discharge: 2022-12-20 | Disposition: A | Discharge status: Home / Self Care | Payer: Medicaid Other | Source: Ambulatory Visit | Attending: Obstetrics and Gynecology | Admitting: Obstetrics and Gynecology

## 2022-12-20 ENCOUNTER — Ambulatory Visit (INDEPENDENT_AMBULATORY_CARE_PROVIDER_SITE_OTHER): Payer: Medicaid Other | Admitting: Obstetrics and Gynecology

## 2022-12-20 VITALS — BP 113/76 | HR 73 | Ht 66.0 in | Wt 193.0 lb

## 2022-12-20 DIAGNOSIS — Z9889 Other specified postprocedural states: Secondary | ICD-10-CM

## 2022-12-20 DIAGNOSIS — Z01419 Encounter for gynecological examination (general) (routine) without abnormal findings: Secondary | ICD-10-CM

## 2022-12-20 DIAGNOSIS — Z1339 Encounter for screening examination for other mental health and behavioral disorders: Secondary | ICD-10-CM | POA: Diagnosis not present

## 2022-12-20 DIAGNOSIS — Z113 Encounter for screening for infections with a predominantly sexual mode of transmission: Secondary | ICD-10-CM | POA: Insufficient documentation

## 2022-12-20 NOTE — Progress Notes (Signed)
   ANNUAL EXAM Patient name: Gabrielle Zimmerman MRN [id number]  Date of birth: 1981/07/01 Chief Complaint:   Gynecologic Exam  History of Present Illness:   Gabrielle Zimmerman is a 42 y.o. [phone] female being seen today for a routine annual exam.   Current complaints: Has had trouble losing weight. Wonders if it is her Nuvaring. Her usual methods are not working for weight loss. She uses Nuvaring for birth control and would like to try off it. She will use condoms. 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   Current birth control: Nuvaring  Patient's last menstrual period was 11/05/2022 (exact date).   Last Pap/Pap History: 12/2021. Results were: NILM w/ HRHPV negative. H/O abnormal pap: yes, h/o LEEP 20 years ago   Health Maintenance Due  Topic Date Due   COVID-19 Vaccine (1) Never done   DTaP/Tdap/Td (2 - Td or Tdap) 12/27/2019    Review of Systems:   Pertinent items are noted in HPI Denies any headaches, blurred vision, fatigue, shortness of breath, chest pain, abdominal pain, abnormal vaginal discharge/itching/odor/irritation, problems with periods, bowel movements, urination, or intercourse unless otherwise stated above.  Pertinent History Reviewed:  Reviewed past medical,surgical, social and family history.  Reviewed problem list, medications and allergies. Physical Assessment:   Vitals:   12/20/22 0838  BP: 113/76  Pulse: 73  Weight: 193 lb (87.5 kg)  Height: 5\' 6"  (1.676 m)  Body mass index is 31.15 kg/m.   Physical Examination:  General appearance - well appearing, and in no distress Mental status - alert, oriented to person, place, and time Psych:  She has a normal mood and affect Skin - warm and dry, normal color, no suspicious lesions noted Chest - effort normal Heart - normal rate  Breasts - breasts appear normal, no suspicious masses, no skin  or nipple changes or axillary nodes Abdomen - soft, nontender, nondistended, no masses or organomegaly Pelvic -  VULVA: normal appearing vulva with no masses, tenderness or lesions  VAGINA: normal appearing vagina with normal color and discharge, no lesions  CERVIX: normal appearing cervix without discharge or lesions, no CMT UTERUS: uterus is felt to be normal size, shape, consistency and nontender  ADNEXA: No adnexal masses or tenderness noted. Extremities:  No swelling or varicosities noted  Chaperone present for exam  No results found for this or any previous visit (from the past 24 hour(s)).  Assessment & Plan:  Shermya was seen today for gynecologic exam.  Diagnoses and all orders for this visit:  Encounter for annual routine gynecological examination - Cervical cancer screening: Discussed guidelines. Pap with HPV wnl 12/2021.  - Gardasil: Counseled and declines - GC/CT: accepts along with blood work.  - Birth Control: Nuvaring to now but will switch to condoms to see if helps with weight loss. Discussed if cycles heavy off birth control, can do TXA. Also discussed options such as Mirena and BTS.  - Breast Health: Encouraged self breast awareness/SBE. Teaching provided.  - F/U 12 months and prn  H/O LEEP  Routine screening for STI (sexually transmitted infection) -     RPR+HBsAg+HCVAb+...      Orders Placed This Encounter  Procedures   RPR+HBsAg+HCVAb+...    Meds: No orders of the defined types were placed in this encounter.   Follow-up: No follow-ups on file.  Milas Hock, MD 12/20/2022 9:18 AM

## 2022-12-21 LAB — CERVICOVAGINAL ANCILLARY ONLY
Chlamydia: NEGATIVE
Comment: NEGATIVE
Comment: NEGATIVE
Comment: NORMAL
Neisseria Gonorrhea: NEGATIVE
Trichomonas: NEGATIVE

## 2022-12-21 LAB — RPR+HBSAG+HCVAB+...
HIV Screen 4th Generation wRfx: NONREACTIVE
Hep C Virus Ab: NONREACTIVE
Hepatitis B Surface Ag: NEGATIVE
RPR Ser Ql: NONREACTIVE

## 2023-02-23 ENCOUNTER — Other Ambulatory Visit: Payer: Self-pay | Admitting: Family Medicine

## 2023-03-09 ENCOUNTER — Other Ambulatory Visit (HOSPITAL_BASED_OUTPATIENT_CLINIC_OR_DEPARTMENT_OTHER): Payer: Self-pay

## 2023-04-11 ENCOUNTER — Other Ambulatory Visit: Payer: Self-pay

## 2023-04-11 DIAGNOSIS — Z1231 Encounter for screening mammogram for malignant neoplasm of breast: Secondary | ICD-10-CM

## 2023-04-12 ENCOUNTER — Other Ambulatory Visit (HOSPITAL_BASED_OUTPATIENT_CLINIC_OR_DEPARTMENT_OTHER): Payer: Self-pay

## 2023-04-12 MED ORDER — SEMAGLUTIDE-WEIGHT MANAGEMENT 0.25 MG/0.5ML ~~LOC~~ SOAJ
0.2500 mg | SUBCUTANEOUS | 0 refills | Status: DC
  Filled 2023-04-12: qty 2, 28d supply, fill #0

## 2023-04-13 ENCOUNTER — Telehealth (HOSPITAL_BASED_OUTPATIENT_CLINIC_OR_DEPARTMENT_OTHER): Payer: Self-pay

## 2023-04-16 ENCOUNTER — Other Ambulatory Visit (HOSPITAL_BASED_OUTPATIENT_CLINIC_OR_DEPARTMENT_OTHER): Payer: Self-pay

## 2023-04-17 ENCOUNTER — Ambulatory Visit (HOSPITAL_BASED_OUTPATIENT_CLINIC_OR_DEPARTMENT_OTHER)
Admission: RE | Admit: 2023-04-17 | Discharge: 2023-04-17 | Disposition: A | Discharge status: Home / Self Care | Payer: Medicaid Other | Source: Ambulatory Visit | Attending: Medical | Admitting: Medical

## 2023-04-17 ENCOUNTER — Encounter (HOSPITAL_BASED_OUTPATIENT_CLINIC_OR_DEPARTMENT_OTHER): Payer: Self-pay

## 2023-04-17 DIAGNOSIS — Z1231 Encounter for screening mammogram for malignant neoplasm of breast: Secondary | ICD-10-CM | POA: Diagnosis present

## 2024-01-10 ENCOUNTER — Ambulatory Visit: Admitting: Family Medicine

## 2024-01-10 ENCOUNTER — Other Ambulatory Visit (HOSPITAL_COMMUNITY)
Admission: RE | Admit: 2024-01-10 | Discharge: 2024-01-10 | Disposition: A | Discharge status: Home / Self Care | Source: Ambulatory Visit | Attending: Family Medicine | Admitting: Family Medicine

## 2024-01-10 VITALS — BP 124/85 | HR 91 | Ht 66.0 in | Wt 168.0 lb

## 2024-01-10 DIAGNOSIS — R102 Pelvic and perineal pain: Secondary | ICD-10-CM | POA: Diagnosis present

## 2024-01-10 DIAGNOSIS — Z01419 Encounter for gynecological examination (general) (routine) without abnormal findings: Secondary | ICD-10-CM

## 2024-01-10 DIAGNOSIS — F32A Depression, unspecified: Secondary | ICD-10-CM

## 2024-01-10 MED ORDER — BUPROPION HCL ER (XL) 150 MG PO TB24: 150.0000 mg | ORAL_TABLET | Freq: Every day | ORAL | 3 refills | Status: AC

## 2024-01-10 NOTE — Progress Notes (Signed)
 ANNUAL EXAM Patient name: Gabrielle Zimmerman MRN [id number]  Date of birth: Jan 28, 1981 Chief Complaint:   Annual Exam  History of Present Illness:   Gabrielle Zimmerman is a 43 y.o.  [phone]  female  being seen today for a routine annual exam.  Current complaints: Under a lot of stress at work - working for [employer] and is often in situations that she doesn't think is right. Travels a lot. Was using therapy through company, but is concerned about retribution. Has hired Pensions consultant. Feels anxious. Has never used antidepressants.  Had termination of pregnancy on June 10th (surgical). Was having some pelvic pain prior to the pregnancy, which continued during pregnancy and afterwards. Gets pain in the right pelvis - intermittent. When she gets the pain, lasts for 2-3 hours at a time. Severe. No fevers, chills. No abnormal discharge. No provoking or palliating factors.  Patient's last menstrual period was 09/30/2023 (exact date).    Last pap 12/2021. Results were: normal with neg HPV. H/O abnormal pap: no Last mammogram: 04/2023. Results were: normal.  Last colonoscopy: n/a     01/10/2024    9:50 AM 12/20/2022    8:56 AM 06/24/2020    9:52 AM 06/10/2020    1:19 PM  Depression screen PHQ 2/9  Decreased Interest 3 0 0 0  Down, Depressed, Hopeless 3 1 0 1  PHQ - 2 Score 6 1 0 1  Altered sleeping 3 3    Tired, decreased energy 3 3    Change in appetite 3 3    Feeling bad or failure about yourself  3 3    Trouble concentrating 3 2    Moving slowly or fidgety/restless 0 0    Suicidal thoughts 0 0    PHQ-9 Score 21 15          01/10/2024    9:50 AM 12/20/2022    8:56 AM 06/24/2020    9:52 AM  GAD 7 : Generalized Anxiety Score  Nervous, Anxious, on Edge 3 3 0  Control/stop worrying 3 3 0  Worry too much - different things 3 3 0  Trouble relaxing 3 0 0  Restless 3 0 0  Easily annoyed or irritable 0 3 0  Afraid - awful might happen 3 3 0  Total GAD 7 Score 18 15 0     Review of Systems:    Pertinent items are noted in HPI Denies any headaches, blurred vision, fatigue, shortness of breath, chest pain, abdominal pain, abnormal vaginal discharge/itching/odor/irritation, problems with periods, bowel movements, urination, or intercourse unless otherwise stated above. Pertinent History Reviewed:  Reviewed past medical,surgical, social and family history.  Reviewed problem list, medications and allergies. Physical Assessment:   Vitals:   01/10/24 0941  BP: 124/85  Pulse: 91  Weight: 168 lb (76.2 kg)  Height: 5' 6 (1.676 m)  Body mass index is 27.12 kg/m.        Physical Examination:   General appearance - well appearing, and in no distress  Mental status - alert, oriented to person, place, and time  Psych:  She has a normal mood and affect  Skin - warm and dry, normal color, no suspicious lesions noted  Chest - effort normal, all lung fields clear to auscultation bilaterally  Heart - normal rate and regular rhythm  Neck:  midline trachea, no thyromegaly or nodules  Breasts - breasts appear normal, no suspicious masses, no skin or nipple changes or axillary nodes  Abdomen - soft,  nontender, nondistended, no masses or organomegaly  Pelvic - VULVA: normal appearing vulva with no masses, tenderness or lesions  VAGINA: normal appearing vagina with normal color. Thick white discharge. No lesions  CERVIX: normal appearing cervix without discharge or lesions, no CMT  Thin prep pap is not done   UTERUS: uterus is felt to be normal size, shape, consistency and nontender   ADNEXA: No adnexal masses or tenderness noted.  Extremities:  No swelling or varicosities noted  Chaperone present for exam  Assessment & Plan:  1. Well woman exam with routine gynecological exam (Primary)  2. Pelvic pain Check cultures. Check US  - Cervicovaginal ancillary only( [place]) - US  PELVIC COMPLETE WITH TRANSVAGINAL; Future  3. Depression, unspecified depression type Will refer to  therapist. Will check CBC and TSH. Discussed use of antidepressant. She would like a prescription - not certain if she will take it or not.  Will follow up in 4 weeks. - CBC - TSH Rfx on Abnormal to Free T4 - Ambulatory referral to Behavioral Health   Labs/procedures today:   Orders Placed This Encounter  Procedures   US  PELVIC COMPLETE WITH TRANSVAGINAL   CBC   TSH Rfx on Abnormal to Free T4   Ambulatory referral to Behavioral Health    Meds: No orders of the defined types were placed in this encounter.   Follow-up: Return in about 4 weeks (around 02/07/2024) for depression.  Rashay Barnette J Tahja Liao, DO 01/10/2024 10:39 AM

## 2024-01-11 ENCOUNTER — Ambulatory Visit: Payer: Self-pay | Admitting: Family Medicine

## 2024-01-11 DIAGNOSIS — N83202 Unspecified ovarian cyst, left side: Secondary | ICD-10-CM

## 2024-01-11 LAB — CBC
Hematocrit: 39.2 % (ref 34.0–46.6)
Hemoglobin: 13.1 g/dL (ref 11.1–15.9)
MCH: 34 pg — ABNORMAL HIGH (ref 26.6–33.0)
MCHC: 33.4 g/dL (ref 31.5–35.7)
MCV: 102 fL — ABNORMAL HIGH (ref 79–97)
Platelets: 203 x10E3/uL (ref 150–450)
RBC: 3.85 x10E6/uL (ref 3.77–5.28)
RDW: 12.1 % (ref 11.7–15.4)
WBC: 5.8 x10E3/uL (ref 3.4–10.8)

## 2024-01-11 LAB — CERVICOVAGINAL ANCILLARY ONLY
Bacterial Vaginitis (gardnerella): NEGATIVE
Candida Glabrata: NEGATIVE
Candida Vaginitis: POSITIVE — AB
Chlamydia: NEGATIVE
Comment: NEGATIVE
Comment: NEGATIVE
Comment: NEGATIVE
Comment: NEGATIVE
Comment: NEGATIVE
Comment: NORMAL
Neisseria Gonorrhea: NEGATIVE
Trichomonas: NEGATIVE

## 2024-01-11 LAB — TSH RFX ON ABNORMAL TO FREE T4: TSH: 1.16 u[IU]/mL (ref 0.450–4.500)

## 2024-01-14 ENCOUNTER — Other Ambulatory Visit: Payer: Self-pay

## 2024-01-14 DIAGNOSIS — B379 Candidiasis, unspecified: Secondary | ICD-10-CM

## 2024-01-14 MED ORDER — FLUCONAZOLE 150 MG PO TABS: 150.0000 mg | ORAL_TABLET | Freq: Once | ORAL | 0 refills | Status: AC

## 2024-01-14 NOTE — Progress Notes (Signed)
 Patient notified of positive yeast results via Mychart. Diflucan  150 mg take 1 tablet PO was sent to her pharmacy.  Nahuel Wilbert l Ritika Hellickson, CMA

## 2024-01-15 ENCOUNTER — Ambulatory Visit (HOSPITAL_BASED_OUTPATIENT_CLINIC_OR_DEPARTMENT_OTHER)
Admission: RE | Admit: 2024-01-15 | Discharge: 2024-01-15 | Disposition: A | Discharge status: Home / Self Care | Source: Ambulatory Visit | Attending: Family Medicine | Admitting: Family Medicine

## 2024-01-15 DIAGNOSIS — R102 Pelvic and perineal pain: Secondary | ICD-10-CM

## 2024-01-25 ENCOUNTER — Ambulatory Visit (HOSPITAL_BASED_OUTPATIENT_CLINIC_OR_DEPARTMENT_OTHER)
Admission: RE | Admit: 2024-01-25 | Discharge: 2024-01-25 | Disposition: A | Discharge status: Home / Self Care | Source: Ambulatory Visit | Attending: Family Medicine | Admitting: Family Medicine

## 2024-01-25 DIAGNOSIS — R102 Pelvic and perineal pain: Secondary | ICD-10-CM | POA: Insufficient documentation

## 2024-02-07 ENCOUNTER — Ambulatory Visit: Admitting: Family Medicine

## 2024-02-07 VITALS — BP 122/86 | HR 73 | Wt 177.0 lb

## 2024-02-07 DIAGNOSIS — F32A Depression, unspecified: Secondary | ICD-10-CM

## 2024-02-08 NOTE — Progress Notes (Signed)
   Subjective:    Patient ID: Gabrielle Zimmerman, female    DOB: February 10, 1981, 43 y.o.   MRN: [id number]  HPI Patient seen for follow-up of depression.  She is continues to undergo legal battle with her employer and is concerned that she would not be able to find a job locally due to noncompete causes.  She has been speaking with her attorney, but continues to feel anxious mostly about things that she is unable to control, which she recognizes.  Sleep is still mixed.SABRA  She has not started medication.  Still waiting for call from counseling.   Review of Systems     Objective:   Physical Exam Constitutional:      Appearance: Normal appearance.  Cardiovascular:     Rate and Rhythm: Normal rate and regular rhythm.     Pulses: Normal pulses.     Heart sounds: Normal heart sounds.  Pulmonary:     Effort: Pulmonary effort is normal.     Breath sounds: Normal breath sounds.  Skin:    Capillary Refill: Capillary refill takes less than 2 seconds.  Neurological:     General: No focal deficit present.     Mental Status: She is alert.  Psychiatric:        Mood and Affect: Mood normal.        Behavior: Behavior normal.        Thought Content: Thought content normal.        Judgment: Judgment normal.        Assessment & Plan:  1. Depression, unspecified depression type (Primary) Discussed starting Wellbutrin .  Could also use magnesium at night to help with sleep and anxiety.  We discussed that is able to stop Wellbutrin  at any point with minimal side effects or withdrawals.  He will continue to think about options.  Follow-up 6 to 8 weeks

## 2024-03-13 ENCOUNTER — Encounter: Payer: Self-pay | Admitting: Family Medicine

## 2024-03-13 ENCOUNTER — Ambulatory Visit (INDEPENDENT_AMBULATORY_CARE_PROVIDER_SITE_OTHER): Admitting: Family Medicine

## 2024-03-13 VITALS — BP 110/70 | HR 84 | Temp 97.6°F | Ht 64.75 in | Wt 171.0 lb

## 2024-03-13 DIAGNOSIS — R232 Flushing: Secondary | ICD-10-CM

## 2024-03-13 DIAGNOSIS — D508 Other iron deficiency anemias: Secondary | ICD-10-CM | POA: Diagnosis not present

## 2024-03-13 DIAGNOSIS — R6889 Other general symptoms and signs: Secondary | ICD-10-CM | POA: Diagnosis not present

## 2024-03-13 NOTE — Patient Instructions (Signed)
 Preventing Unhealthy Weight Gain, Adult  Staying at a healthy weight is important to your overall health. When fat builds up in your body, you may become overweight or obese. Being overweight or obese increases your risk of developing various health problems.  Unhealthy weight gain is often the result of making unhealthy food choices or not getting enough exercise. You can make changes to your lifestyle to prevent obesity and stay as healthy as possible.  How can unhealthy weight gain affect me?  Being overweight or obese can cause you to develop joint or bone problems, which can make it hard for you to stay active or do activities you enjoy. Being overweight also puts stress on your heart and lungs and can lead to health problems such as:  Diabetes.  Heart disease.  Some types of cancer.  Stroke.  Eating healthy, staying active, and having healthy habits can help to prevent unhealthy weight gain and lower your risk for health problems. These lifestyle changes will also help you manage stress and emotions, improve your self-esteem, and connect with friends and family.  What can increase my risk?  In addition to certain eating and lifestyle choices, some other factors that may make you more likely to have unhealthy weight gain include:  Having a family history of obesity.  Living in an area with limited access to:  [place], recreation centers, or sidewalks.  Healthy food choices, such as grocery stores and farmers' markets.  What actions can I take to prevent unhealthy weight gain?  Nutrition    Eat only as much as your body needs. To do this:  Pay attention to signs that you are hungry or full. Stop eating as soon as you feel full.  If you feel hungry, try drinking water first before eating. Drink enough water so your urine is pale yellow.  Eat smaller portions. Pay attention to portion sizes when eating out.  Look at serving sizes on food labels. Most foods contain more than one serving per container.  Eat the  recommended number of calories for your gender and activity level. For most active people, a daily total of 2,000 calories is appropriate. If you are trying to lose weight or are not very active, you may need to eat fewer calories. Talk with your health care provider or a dietitian about how many calories you need each day.  Choose healthy foods, such as:  Fruits and vegetables. At each meal, try to fill at least half of your plate with fruits and vegetables.  Whole grains, such as whole-wheat bread, brown rice, and quinoa.  Lean meats, such as chicken or fish.  Other healthy proteins, such as beans, eggs, or tofu.  Healthy fats, such as nuts, seeds, fatty fish, and olive oil.  Low-fat or fat-free dairy products.  Check food labels, and avoid food and drinks that:  Are high in calories.  Have added sugar.  Are high in sodium.  Have saturated fats or trans fats.  Cook foods in healthier ways, such as by baking, broiling, or grilling.  Make a meal plan for the week, and shop with a grocery list to help you stay on track with your purchases. Try to avoid going to the grocery store when you are hungry.  When grocery shopping, try to shop around the outside of the store first, where the fresh foods are. Doing this helps you avoid prepackaged foods, which can be high in sugar, salt (sodium), and fat.  Lifestyle    Exercise  for 30 or more minutes on 5 or more days each week. Exercising may include brisk walking, yard work, biking, running, swimming, and team sports like basketball and soccer. Ask your health care provider which exercises are safe for you.  Do activities that strengthen the muscles, such as lifting weights or using resistance bands, on 2 or more days a week.  Do not use any products that contain nicotine or tobacco. These products include cigarettes, chewing tobacco, and vaping devices, such as e-cigarettes. If you need help quitting, ask your health care provider.  If you drink alcohol:  Limit how much you  have to:  0-1 drink a day for women who are not pregnant.  0-2 drinks a day for men.  Know how much alcohol is in a drink. In the [place] one drink equals one 12 oz bottle of beer (355 mL), one 5 oz glass of wine (148 mL), or one 1 oz glass of hard liquor (44 mL).  Try to get 7-9 hours of sleep each night.  Other changes  Keep a food and activity journal to keep track of:  What you ate and how many calories you had. Remember to count the calories in sauces, dressings, and side dishes.  Whether you were active, and what exercises you did.  Your calorie, weight, and activity goals.  Check your weight regularly. Track any changes. If you notice that you have gained weight, make changes to your diet or activity routine.  Avoid taking weight-loss medicines or supplements. Talk to your health care provider before starting any new medicine or supplement.  Talk to your health care provider before trying any new diet or exercise plan.  Where to find more information  Talk with your health care provider or a dietitian about healthy eating and healthy lifestyle choices. You may also find information from:  [place] Department of Agriculture, MyPlate: https://ball-collins.biz/  American Heart Association: www.heart.org  Centers for Disease Control and Prevention: FootballExhibition.com.br  Summary  Eating healthy, staying active, and having healthy habits can help to prevent unhealthy weight gain and lower your risk for health problems such as heart disease, diabetes, some types of cancer, and stroke.  Being overweight or obese can cause you to develop joint or bone problems, which can make it hard for you to stay active or do activities you enjoy.  You can prevent unhealthy weight gain by eating a healthy diet, exercising regularly, not smoking, limiting alcohol, and getting enough sleep.  Talk with your health care provider or a dietitian for guidance about healthy eating and healthy lifestyle choices.  This information is not intended to replace  advice given to you by your health care provider. Make sure you discuss any questions you have with your health care provider.  Document Revised: 01/14/2021 Document Reviewed: 01/14/2021  Elsevier Patient Education  2024 [employer].

## 2024-03-13 NOTE — Progress Notes (Signed)
 Subjective:    Patient ID: Gabrielle Zimmerman, female    DOB: 23-Jul-1980, 43 y.o.   MRN: [id number]  Chief Complaint  Patient presents with   New Patient (Initial Visit)    Est care;    Weight Check    Want to discuss weight concerns; keeps fluctuating    Hot Flashes    In the last month getting really hot at times and sweating really heavy; sometimes not even doing anything, sitting in air conditioning and whole body would just get hot and would start sweating    HPI Patient is in today to establish as a new patient.  Discussed the use of AI scribe software for clinical note transcription with the patient, who gave verbal consent to proceed.  History of Present Illness Gabrielle Zimmerman is a 43 year old female who presents with excessive sweating and weight fluctuations.  She has been experiencing excessive sweating for the past month, occurring even when inactive, such as sitting at home. The sweating affects her underarms, back, behind her knees, and private area. Initially attributed to hot weather, it persists in air-conditioned environments. She has not discussed this with her OB GYN.  She has a history of anemia and experiences cold intolerance during winter. She does not currently take iron supplements but has started taking a methylated multivitamin and magnesium complex. She is unsure of the iron content in her multivitamin.  She mentions weight fluctuations without a clear pattern, noting weight decreases when inactive and increases with physical activity. Her weight has varied significantly over the past two years, reaching a high of 203 pounds last year and dropping to 156 pounds by January of this year.  Her menstrual periods are regular but heavy, lasting four to five days with three to four days being particularly heavy. She has a history of long, heavy periods as a teenager, which were managed with birth control. She is not currently on birth control. She has had ovarian  cysts in the past, with two ruptures, one in 2011 and another in 2022. She recently experienced reminiscent pain and was found to have a cyst that is being monitored.  She has a history of exercise-induced asthma but has not used her inhaler in about twenty years. She recently obtained a new inhaler to avoid expiration.  Her family history includes a mother who may have had thyroid  issues, a father with diabetes, and a sister with severe gluten intolerance. Her maternal grandfather has atrial fibrillation, and her maternal grandmother has arthritis, possibly in the spine.   Past Medical History:  Diagnosis Date   Allergy 1999   Anemia    Asthma    Ovarian cyst     Past Surgical History:  Procedure Laterality Date   LEEP      Family History  Problem Relation Age of Onset   Healthy Mother    Diabetes Father    Healthy Brother    Healthy Brother    Osteoarthritis Maternal Grandmother    Atrial fibrillation Maternal Grandfather     Social History   Socioeconomic History   Marital status: Divorced    Spouse name: Not on file   Number of children: 3   Years of education: Bachelor's degree   Highest education level: Bachelor's degree (e.g., BA, AB, BS)  Occupational History   Occupation: Nutritionist and Systems analyst   Occupation: electrophysiology    Comment: [employer]  Tobacco Use   Smoking status: Never   Smokeless  tobacco: Never  Vaping Use   Vaping status: Never Used  Substance and Sexual Activity   Alcohol use: Yes    Comment: weekends   Drug use: Never   Sexual activity: Yes    Partners: Male    Birth control/protection: Inserts  Other Topics Concern   Not on file  Social History Narrative   Exercise---- not now due to work schedule   Social Drivers of Health   Financial Resource Strain: High Risk (03/08/2024)   Overall Financial Resource Strain (CARDIA)    Difficulty of Paying Living Expenses: Hard  Food Insecurity: Food Insecurity Present  (03/08/2024)   Hunger Vital Sign    Worried About Running Out of Food in the Last Year: Often true    Ran Out of Food in the Last Year: Sometimes true  Transportation Needs: No Transportation Needs (03/08/2024)   PRAPARE - Administrator, Civil Service (Medical): No    Lack of Transportation (Non-Medical): No  Physical Activity: Sufficiently Active (03/08/2024)   Exercise Vital Sign    Days of Exercise per Week: 5 days    Minutes of Exercise per Session: 120 min  Stress: Stress Concern Present (03/08/2024)   [employer] of Occupational Health - Occupational Stress Questionnaire    Feeling of Stress: Very much  Social Connections: Unknown (03/08/2024)   Social Connection and Isolation Panel    Frequency of Communication with Friends and Family: Once a week    Frequency of Social Gatherings with Friends and Family: Patient declined    Attends Religious Services: Patient declined    Database administrator or Organizations: Yes    Attends Banker Meetings: 1 to 4 times per year    Marital Status: Divorced  Catering manager Violence: At Risk (01/01/2023)   Received from [facility]   HITS    Over the last 12 months how often did your partner physically hurt you?: Never    Over the last 12 months how often did your partner insult you or talk down to you?: Frequently    Over the last 12 months how often did your partner threaten you with physical harm?: Never    Over the last 12 months how often did your partner scream or curse at you?: Fairly often    Outpatient Medications Prior to Visit  Medication Sig Dispense Refill   buPROPion  (WELLBUTRIN  XL) 150 MG 24 hr tablet Take 1 tablet (150 mg total) by mouth daily. 30 tablet 3   HALOETTE 0.12-0.015 MG/24HR vaginal ring INSERT 1 RING VAGINALLY AND LEAVE IN PLACE FOR 3 WEEKS( 21 DAYS) THEN REPLACE FOR CYCLE SUPPRESSION (Patient not taking: Reported on 03/13/2024) 4 each 3   Semaglutide -Weight Management 0.25 MG/0.5ML SOAJ  Inject 0.25 mg into the skin once a week. 2 mL 0   No facility-administered medications prior to visit.    Allergies  Allergen Reactions   Pork (Porcine) Protein Nausea And Vomiting   Pork-Derived Products Nausea And Vomiting, Diarrhea and Other (See Comments)    Stomach cramps/gas    Wegmans Supreme Diapers Size 5 [Diapers & Supplies]     Review of Systems  Constitutional:  Negative for chills, fever and malaise/fatigue.  HENT:  Negative for congestion and hearing loss.   Eyes:  Negative for blurred vision and discharge.  Respiratory:  Negative for cough, sputum production and shortness of breath.   Cardiovascular:  Negative for chest pain, palpitations and leg swelling.  Gastrointestinal:  Negative for abdominal pain, blood  in stool, constipation, diarrhea, heartburn, nausea and vomiting.  Genitourinary:  Negative for dysuria, frequency, hematuria and urgency.  Musculoskeletal:  Negative for back pain, falls and myalgias.  Skin:  Negative for rash.  Neurological:  Negative for dizziness, sensory change, loss of consciousness, weakness and headaches.  Endo/Heme/Allergies:  Negative for environmental allergies. Does not bruise/bleed easily.  Psychiatric/Behavioral:  Negative for depression and suicidal ideas. The patient is not nervous/anxious and does not have insomnia.        Objective:    Physical Exam Vitals and nursing note reviewed.  Constitutional:      General: She is not in acute distress.    Appearance: Normal appearance. She is well-developed.  HENT:     Head: Normocephalic and atraumatic.  Eyes:     General: No scleral icterus.       Right eye: No discharge.        Left eye: No discharge.  Cardiovascular:     Rate and Rhythm: Normal rate and regular rhythm.     Heart sounds: No murmur heard. Pulmonary:     Effort: Pulmonary effort is normal. No respiratory distress.     Breath sounds: Normal breath sounds.  Musculoskeletal:        General: Normal range of  motion.     Cervical back: Normal range of motion and neck supple.     Right lower leg: No edema.     Left lower leg: No edema.  Skin:    General: Skin is warm and dry.  Neurological:     Mental Status: She is alert and oriented to person, place, and time.  Psychiatric:        Mood and Affect: Mood normal.        Behavior: Behavior normal.        Thought Content: Thought content normal.        Judgment: Judgment normal.     BP 110/70   Pulse 84   Temp 97.6 F (36.4 C)   Ht 5' 4.75 (1.645 m)   Wt 171 lb (77.6 kg)   SpO2 99%   BMI 28.68 kg/m  Wt Readings from Last 3 Encounters:  03/13/24 171 lb (77.6 kg)  02/07/24 177 lb (80.3 kg)  01/10/24 168 lb (76.2 kg)    Diabetic Foot Exam - Simple   No data filed    Lab Results  Component Value Date   WBC 5.8 03/13/2024   HGB 12.3 03/13/2024   HCT 36.2 03/13/2024   PLT 236.0 03/13/2024   GLUCOSE 78 03/13/2024   CHOL 166 03/13/2024   TRIG 43.0 03/13/2024   HDL 70.20 03/13/2024   LDLCALC 87 03/13/2024   ALT 9 03/13/2024   AST 15 03/13/2024   NA 138 03/13/2024   K 4.4 03/13/2024   CL 103 03/13/2024   CREATININE 1.00 03/13/2024   BUN 11 03/13/2024   CO2 28 03/13/2024   TSH 0.77 03/13/2024   INR 1.1 08/31/2019   HGBA1C 4.9 06/24/2020    Lab Results  Component Value Date   TSH 0.77 03/13/2024   Lab Results  Component Value Date   WBC 5.8 03/13/2024   HGB 12.3 03/13/2024   HCT 36.2 03/13/2024   MCV 99.1 03/13/2024   PLT 236.0 03/13/2024   Lab Results  Component Value Date   NA 138 03/13/2024   K 4.4 03/13/2024   CO2 28 03/13/2024   GLUCOSE 78 03/13/2024   BUN 11 03/13/2024   CREATININE 1.00 03/13/2024  BILITOT 0.5 03/13/2024   ALKPHOS 38 (L) 03/13/2024   AST 15 03/13/2024   ALT 9 03/13/2024   PROT 7.4 03/13/2024   ALBUMIN 4.5 03/13/2024   CALCIUM 9.6 03/13/2024   ANIONGAP 11 07/03/2021   GFR 70.24 03/13/2024   Lab Results  Component Value Date   CHOL 166 03/13/2024   Lab Results  Component  Value Date   HDL 70.20 03/13/2024   Lab Results  Component Value Date   LDLCALC 87 03/13/2024   Lab Results  Component Value Date   TRIG 43.0 03/13/2024   Lab Results  Component Value Date   CHOLHDL 2 03/13/2024   Lab Results  Component Value Date   HGBA1C 4.9 06/24/2020       Assessment & Plan:  Iron deficiency anemia secondary to inadequate dietary iron intake -     CBC with Differential/Platelet -     Iron, TIBC and Ferritin Panel  Hot flashes -     CBC with Differential/Platelet -     Thyroid  peroxidase antibody -     Iron, TIBC and Ferritin Panel -     Estradiol  -     Luteinizing hormone -     Follicle stimulating hormone  Fluctuation of weight -     Thyroid  Panel With TSH -     Comprehensive metabolic panel with GFR -     Lipid panel -     Thyroid  peroxidase antibody -     Iron, TIBC and Ferritin Panel  Assessment and Plan Assessment & Plan Heavy menstrual bleeding   Menstrual periods are regular but heavier than usual, lasting 4-5 days with 3-4 days of heavy bleeding. Previously managed with birth control during adolescence, she is currently not on birth control. Perform an ultrasound on September 15 to evaluate for fibroids or other abnormalities and check hormone levels.  Ovarian cyst under surveillance   An ovarian cyst was identified on a recent ultrasound, with a history of two previous ruptures. Monitor the ovarian cyst with scheduled ultrasound.  Generalized hyperhidrosis   She experiences excessive sweating over the past month affecting multiple areas, including axillae, back, popliteal fossae, and genital region, even in non-heat conditions. Differential includes hormonal changes or thyroid  dysfunction. Order a comprehensive thyroid  panel.  Abnormal weight fluctuation   Significant weight fluctuation has occurred over the past two years, with recent cycles of weight loss and gain. Possible contributing factors include stress, dietary habits, and  lack of regular exercise, with potential hormonal influence considered. Check thyroid  panel for potential hormonal influence. Discussed a local weight loss program, but her current travel schedule may impede participation.  Iron deficiency anemia   She has anemia with symptoms including feeling cold in winter and is not currently taking iron supplements. This may contribute to fatigue and other symptoms. Check iron levels.  Anxiety related to work stress   Significant work-related stress due to job training and travel demands may contribute to weight fluctuation and menstrual irregularities.    Jullianna Gabor R Lowne Chase, DO

## 2024-03-14 LAB — LIPID PANEL
Cholesterol: 166 mg/dL (ref 0–200)
HDL: 70.2 mg/dL (ref >39.00)
LDL Cholesterol: 87 mg/dL (ref 0–99)
NonHDL: 95.76
Total CHOL/HDL Ratio: 2
Triglycerides: 43 mg/dL (ref 0.0–149.0)
VLDL: 8.6 mg/dL (ref 0.0–40.0)

## 2024-03-14 LAB — COMPREHENSIVE METABOLIC PANEL WITH GFR
ALT: 9 U/L (ref 0–35)
AST: 15 U/L (ref 0–37)
Albumin: 4.5 g/dL (ref 3.5–5.2)
Alkaline Phosphatase: 38 U/L — ABNORMAL LOW (ref 39–117)
BUN: 11 mg/dL (ref 6–23)
CO2: 28 meq/L (ref 19–32)
Calcium: 9.6 mg/dL (ref 8.4–10.5)
Chloride: 103 meq/L (ref 96–112)
Creatinine, Ser: 1 mg/dL (ref 0.40–1.20)
GFR: 70.24 mL/min (ref >60.00)
Glucose, Bld: 78 mg/dL (ref 70–99)
Potassium: 4.4 meq/L (ref 3.5–5.1)
Sodium: 138 meq/L (ref 135–145)
Total Bilirubin: 0.5 mg/dL (ref 0.2–1.2)
Total Protein: 7.4 g/dL (ref 6.0–8.3)

## 2024-03-14 LAB — CBC WITH DIFFERENTIAL/PLATELET
Basophils Absolute: 0.1 K/uL (ref 0.0–0.1)
Basophils Relative: 1 % (ref 0.0–3.0)
Eosinophils Absolute: 0.1 K/uL (ref 0.0–0.7)
Eosinophils Relative: 1.5 % (ref 0.0–5.0)
HCT: 36.2 % (ref 36.0–46.0)
Hemoglobin: 12.3 g/dL (ref 12.0–15.0)
Lymphocytes Relative: 31.1 % (ref 12.0–46.0)
Lymphs Abs: 1.8 K/uL (ref 0.7–4.0)
MCHC: 34 g/dL (ref 30.0–36.0)
MCV: 99.1 fl (ref 78.0–100.0)
Monocytes Absolute: 0.5 K/uL (ref 0.1–1.0)
Monocytes Relative: 7.9 % (ref 3.0–12.0)
Neutro Abs: 3.4 K/uL (ref 1.4–7.7)
Neutrophils Relative %: 58.5 % (ref 43.0–77.0)
Platelets: 236 K/uL (ref 150.0–400.0)
RBC: 3.65 Mil/uL — ABNORMAL LOW (ref 3.87–5.11)
RDW: 12.8 % (ref 11.5–15.5)
WBC: 5.8 K/uL (ref 4.0–10.5)

## 2024-03-14 LAB — FOLLICLE STIMULATING HORMONE: FSH: 11.6 m[IU]/mL

## 2024-03-14 LAB — LUTEINIZING HORMONE: LH: 13.46 m[IU]/mL

## 2024-03-15 ENCOUNTER — Ambulatory Visit: Payer: Self-pay | Admitting: Family Medicine

## 2024-03-17 ENCOUNTER — Ambulatory Visit (HOSPITAL_BASED_OUTPATIENT_CLINIC_OR_DEPARTMENT_OTHER)
Admission: RE | Admit: 2024-03-17 | Discharge: 2024-03-17 | Disposition: A | Discharge status: Home / Self Care | Source: Ambulatory Visit | Attending: Family Medicine | Admitting: Family Medicine

## 2024-03-17 DIAGNOSIS — N83202 Unspecified ovarian cyst, left side: Secondary | ICD-10-CM | POA: Diagnosis present

## 2024-03-17 LAB — THYROID PANEL WITH TSH
Free Thyroxine Index: 2.2 (ref 1.4–3.8)
T3 Uptake: 29 % (ref 22–35)
T4, Total: 7.7 ug/dL (ref 5.1–11.9)
TSH: 0.77 m[IU]/L

## 2024-03-17 LAB — THYROID PEROXIDASE ANTIBODY: Thyroperoxidase Ab SerPl-aCnc: 1 [IU]/mL (ref <9)

## 2024-03-17 LAB — IRON,TIBC AND FERRITIN PANEL
%SAT: 23 % (ref 16–45)
Ferritin: 26 ng/mL (ref 16–154)
Iron: 82 ug/dL (ref 40–190)
TIBC: 358 ug/dL (ref 250–450)

## 2024-03-17 LAB — ESTRADIOL: Estradiol: 77 pg/mL

## 2024-03-18 ENCOUNTER — Encounter

## 2024-03-18 DIAGNOSIS — Z1231 Encounter for screening mammogram for malignant neoplasm of breast: Secondary | ICD-10-CM

## 2024-03-19 ENCOUNTER — Other Ambulatory Visit: Payer: Self-pay | Admitting: Family Medicine

## 2024-03-19 DIAGNOSIS — Z1231 Encounter for screening mammogram for malignant neoplasm of breast: Secondary | ICD-10-CM

## 2024-03-24 ENCOUNTER — Ambulatory Visit: Payer: Self-pay | Admitting: Family Medicine

## 2024-03-26 ENCOUNTER — Ambulatory Visit: Admitting: Family Medicine

## 2024-04-16 ENCOUNTER — Ambulatory Visit
Admission: RE | Admit: 2024-04-16 | Discharge: 2024-04-16 | Disposition: A | Discharge status: Home / Self Care | Source: Ambulatory Visit

## 2024-04-16 DIAGNOSIS — Z1231 Encounter for screening mammogram for malignant neoplasm of breast: Secondary | ICD-10-CM

## 2024-04-21 ENCOUNTER — Ambulatory Visit: Payer: Self-pay | Admitting: Family Medicine

## 2024-09-12 ENCOUNTER — Encounter: Admitting: Family Medicine
